# Patient Record
Sex: Male | Born: 1940 | Race: White | Hispanic: No | Marital: Married | Smoking: Never smoker
Health system: Southern US, Community
[De-identification: ages and names within clinical notes are randomized; demographics above are authoritative.]

## PROBLEM LIST (undated history)

## (undated) DIAGNOSIS — I10 Essential (primary) hypertension: Secondary | ICD-10-CM

## (undated) DIAGNOSIS — G4730 Sleep apnea, unspecified: Secondary | ICD-10-CM

## (undated) DIAGNOSIS — M109 Gout, unspecified: Secondary | ICD-10-CM

## (undated) DIAGNOSIS — Z87442 Personal history of urinary calculi: Secondary | ICD-10-CM

## (undated) DIAGNOSIS — M1990 Unspecified osteoarthritis, unspecified site: Secondary | ICD-10-CM

## (undated) DIAGNOSIS — C801 Malignant (primary) neoplasm, unspecified: Secondary | ICD-10-CM

## (undated) HISTORY — PX: TONSILLECTOMY: SUR1361

## (undated) HISTORY — PX: APPENDECTOMY: SHX54

---

## 1999-09-22 DIAGNOSIS — C801 Malignant (primary) neoplasm, unspecified: Secondary | ICD-10-CM

## 1999-09-22 HISTORY — DX: Malignant (primary) neoplasm, unspecified: C80.1

## 1999-09-22 HISTORY — PX: COLON SURGERY: SHX602

## 2013-11-22 ENCOUNTER — Other Ambulatory Visit: Payer: Self-pay | Admitting: Neurosurgery

## 2013-11-22 DIAGNOSIS — M5136 Other intervertebral disc degeneration, lumbar region: Secondary | ICD-10-CM

## 2013-11-27 ENCOUNTER — Ambulatory Visit
Admission: RE | Admit: 2013-11-27 | Discharge: 2013-11-27 | Disposition: A | Discharge status: Home / Self Care | Payer: Medicare Other | Source: Ambulatory Visit | Attending: Neurosurgery | Admitting: Neurosurgery

## 2013-11-27 VITALS — BP 107/75 | HR 73 | Ht 70.0 in | Wt 197.0 lb

## 2013-11-27 DIAGNOSIS — M5136 Other intervertebral disc degeneration, lumbar region: Secondary | ICD-10-CM

## 2013-11-27 DIAGNOSIS — M51369 Other intervertebral disc degeneration, lumbar region without mention of lumbar back pain or lower extremity pain: Secondary | ICD-10-CM

## 2013-11-27 MED ORDER — IOHEXOL 180 MG/ML  SOLN
15.0000 mL | Freq: Once | INTRAMUSCULAR | Status: AC | PRN
  Administered 2013-11-27: 15 mL via INTRATHECAL

## 2013-11-27 MED ORDER — MEPERIDINE HCL 100 MG/ML IJ SOLN
75.0000 mg | Freq: Once | INTRAMUSCULAR | Status: AC
  Administered 2013-11-27: 75 mg via INTRAMUSCULAR

## 2013-11-27 MED ORDER — DIAZEPAM 5 MG PO TABS
5.0000 mg | ORAL_TABLET | Freq: Once | ORAL | Status: AC
  Administered 2013-11-27: 5 mg via ORAL

## 2013-11-27 MED ORDER — ONDANSETRON HCL 4 MG/2ML IJ SOLN
4.0000 mg | Freq: Once | INTRAMUSCULAR | Status: AC
  Administered 2013-11-27: 4 mg via INTRAMUSCULAR

## 2013-11-27 NOTE — Discharge Instructions (Signed)

## 2013-12-18 ENCOUNTER — Other Ambulatory Visit: Payer: Self-pay | Admitting: Neurosurgery

## 2013-12-27 ENCOUNTER — Encounter (HOSPITAL_COMMUNITY): Payer: Self-pay | Admitting: Pharmacy Technician

## 2013-12-28 NOTE — Pre-Procedure Instructions (Addendum)
[name]  12/28/2013   Your procedure is scheduled on:  Friday, April 17.  Report to [facility], Main Entrance [facility]" at  6:55 AM.  Call this number if you have problems the morning of surgery: [phone]   Remember:   Do not eat food or drink liquids after midnight Thursday.   Take these medicines the morning of surgery with A SIP OF WATER: amLODipine (NORVASC).                Take if needed: oxyCODONE (ROXICODONE), colchicine              STOP all herbel meds, nsaids (aleve,naproxen,advil,ibuprofen) 5 days prior to surgery including aspirin, vitamins   Do not wear jewelry, make-up or nail polish.  Do not wear lotions, powders, or perfumes.  Men may shave face and neck.  Do not bring valuables to the hospital.  [facility] is not responsible for any belongings or valuables.               Contacts, dentures or bridgework may not be worn into surgery.  Leave suitcase in the car. After surgery it may be brought to your room.  For patients admitted to the hospital, discharge time is determined by your treatment team.                 Special Instructions: - Special Instructions: [facility] - Preparing for Surgery  Before surgery, you can play an important role.  Because skin is not sterile, your skin needs to be as free of germs as possible.  You can reduce the number of germs on you skin by washing with CHG (chlorahexidine gluconate) soap before surgery.  CHG is an antiseptic cleaner which kills germs and bonds with the skin to continue killing germs even after washing.  Please DO NOT use if you have an allergy to CHG or antibacterial soaps.  If your skin becomes reddened/irritated stop using the CHG and inform your nurse when you arrive at Short Stay.  Do not shave (including legs and underarms) for at least 48 hours prior to the first CHG shower.  You may shave your face.  Please follow these instructions carefully:   1.  Shower with CHG Soap the night  before surgery and the morning of Surgery.  2.  If you choose to wash your hair, wash your hair first as usual with your normal shampoo.  3.  After you shampoo, rinse your hair and body thoroughly to remove the Shampoo.  4.  Use CHG as you would any other liquid soap.  You can apply chg directly  to the skin and wash gently with scrungie or a clean washcloth.  5.  Apply the CHG Soap to your body ONLY FROM THE NECK DOWN.  Do not use on open wounds or open sores.  Avoid contact with your eyes ears, mouth and genitals (private parts).  Wash genitals (private parts)       with your normal soap.  6.  Wash thoroughly, paying special attention to the area where your surgery will be performed.  7.  Thoroughly rinse your body with warm water from the neck down.  8.  DO NOT shower/wash with your normal soap after using and rinsing off the CHG Soap.  9.  Pat yourself dry with a clean towel.            10.  Wear clean pajamas.  11.  Place clean sheets on your bed the night of your first shower and do not sleep with pets.  Day of Surgery  Do not apply any lotions/deodorants the morning of surgery.  Please wear clean clothes to the hospital/surgery center.   Please read over the following fact sheets that you were given: Pain Booklet, Coughing and Deep Breathing, Blood Transfusion Information and Surgical Site Infection Prevention

## 2013-12-29 ENCOUNTER — Encounter (HOSPITAL_COMMUNITY)
Admission: RE | Admit: 2013-12-29 | Discharge: 2013-12-29 | Disposition: A | Discharge status: Home / Self Care | Payer: Medicare Other | Source: Ambulatory Visit | Attending: Anesthesiology | Admitting: Anesthesiology

## 2013-12-29 ENCOUNTER — Encounter (HOSPITAL_COMMUNITY)
Admission: RE | Admit: 2013-12-29 | Discharge: 2013-12-29 | Disposition: A | Discharge status: Home / Self Care | Payer: Medicare Other | Source: Ambulatory Visit | Attending: Neurosurgery | Admitting: Neurosurgery

## 2013-12-29 ENCOUNTER — Encounter (HOSPITAL_COMMUNITY): Payer: Self-pay

## 2013-12-29 DIAGNOSIS — Z01818 Encounter for other preprocedural examination: Secondary | ICD-10-CM | POA: Insufficient documentation

## 2013-12-29 DIAGNOSIS — Z01810 Encounter for preprocedural cardiovascular examination: Secondary | ICD-10-CM | POA: Insufficient documentation

## 2013-12-29 DIAGNOSIS — Z01812 Encounter for preprocedural laboratory examination: Secondary | ICD-10-CM | POA: Insufficient documentation

## 2013-12-29 HISTORY — DX: Sleep apnea, unspecified: G47.30

## 2013-12-29 HISTORY — DX: Malignant (primary) neoplasm, unspecified: C80.1

## 2013-12-29 HISTORY — DX: Essential (primary) hypertension: I10

## 2013-12-29 HISTORY — DX: Personal history of urinary calculi: Z87.442

## 2013-12-29 HISTORY — DX: Unspecified osteoarthritis, unspecified site: M19.90

## 2013-12-29 HISTORY — DX: Gout, unspecified: M10.9

## 2013-12-29 LAB — CBC
HCT: 37.2 % — ABNORMAL LOW (ref 39.0–52.0)
HEMOGLOBIN: 12.6 g/dL — AB (ref 13.0–17.0)
MCH: 31.2 pg (ref 26.0–34.0)
MCHC: 33.9 g/dL (ref 30.0–36.0)
MCV: 92.1 fL (ref 78.0–100.0)
Platelets: 399 10*3/uL (ref 150–400)
RBC: 4.04 MIL/uL — ABNORMAL LOW (ref 4.22–5.81)
RDW: 13.2 % (ref 11.5–15.5)
WBC: 7.6 10*3/uL (ref 4.0–10.5)

## 2013-12-29 LAB — BASIC METABOLIC PANEL
BUN: 26 mg/dL — ABNORMAL HIGH (ref 6–23)
CO2: 22 meq/L (ref 19–32)
Calcium: 10.4 mg/dL (ref 8.4–10.5)
Chloride: 105 mEq/L (ref 96–112)
Creatinine, Ser: 1.31 mg/dL (ref 0.50–1.35)
GFR calc Af Amer: 61 mL/min — ABNORMAL LOW (ref >90)
GFR calc non Af Amer: 52 mL/min — ABNORMAL LOW (ref >90)
GLUCOSE: 82 mg/dL (ref 70–99)
Potassium: 4.8 mEq/L (ref 3.7–5.3)
SODIUM: 141 meq/L (ref 137–147)

## 2013-12-29 LAB — ABO/RH: ABO/RH(D): O POS

## 2013-12-29 LAB — SURGICAL PCR SCREEN
MRSA, PCR: NEGATIVE
STAPHYLOCOCCUS AUREUS: NEGATIVE

## 2013-12-29 LAB — TYPE AND SCREEN
ABO/RH(D): O POS
Antibody Screen: NEGATIVE

## 2014-01-04 MED ORDER — CEFAZOLIN SODIUM-DEXTROSE 2-3 GM-% IV SOLR
2.0000 g | INTRAVENOUS | Status: AC
  Administered 2014-01-05: 2 g via INTRAVENOUS
  Filled 2014-01-04: qty 50

## 2014-01-04 NOTE — H&P (Signed)
[name] is an 73 y.o. male.   Chief Complaint: right leg pain HPI: patient seen because of lumbar pain with radiation to the right leg all the way to the kne but not below. This pain has been going on for at least 2 years. Had 6 epidural with not improvement. No pain in the left leg.   Past Medical History  Diagnosis Date  . Hypertension   . Sleep apnea     cpap 7 yrs  . History of kidney stones   . Arthritis   . Gout   . Cancer 01    colon    Past Surgical History  Procedure Laterality Date  . Colon surgery  01    ca  . Tonsillectomy    . Appendectomy      No family history on file. Social History:  reports that he has never smoked. He has never used smokeless tobacco. He reports that he does not drink alcohol or use illicit drugs.  Allergies: No Known Allergies  No prescriptions prior to admission    No results found for this or any previous visit (from the past 48 hour(s)). No results found.  Review of Systems  Constitutional: Negative.   HENT: Negative.   Eyes: Negative.   Respiratory: Negative.   Cardiovascular: Negative.   Gastrointestinal:       Scar for colon surgery  Genitourinary: Negative.   Musculoskeletal: Positive for back pain.  Skin: Negative.   Neurological: Positive for focal weakness.  Psychiatric/Behavioral: Negative.   positive for lumbar and right leg pain.   There were no vitals taken for this visit. Physical Exam hent, nl. Neck, nl. Cv, nl. Lugs, clear. Abdomen scar from surgery. Extremities there is a difference of 3 to 4 inches between the legs. Neuro,  Dtr, nl.   Assessment/Plan Patient to go ahead with decompression at l3-4 and 4-5 with fusion at the l4-5 level with cages and screws. Patient and wife are aware of risks and benefits  Floyce Stakes 01/04/2014, 6:08 PM

## 2014-01-05 ENCOUNTER — Inpatient Hospital Stay (HOSPITAL_COMMUNITY): Payer: Medicare Other

## 2014-01-05 ENCOUNTER — Encounter (HOSPITAL_COMMUNITY): Payer: Medicare Other | Admitting: Anesthesiology

## 2014-01-05 ENCOUNTER — Inpatient Hospital Stay (HOSPITAL_COMMUNITY)
Admission: RE | Admit: 2014-01-05 | Discharge: 2014-01-11 | DRG: 460 | Disposition: A | Discharge status: Rehabilitation Facility | Payer: Medicare Other | Source: Ambulatory Visit | Attending: Neurosurgery | Admitting: Neurosurgery

## 2014-01-05 ENCOUNTER — Inpatient Hospital Stay (HOSPITAL_COMMUNITY): Payer: Medicare Other | Admitting: Anesthesiology

## 2014-01-05 ENCOUNTER — Encounter (HOSPITAL_COMMUNITY): Payer: Self-pay | Admitting: *Deleted

## 2014-01-05 ENCOUNTER — Encounter (HOSPITAL_COMMUNITY)
Admission: RE | Disposition: A | Discharge status: Rehabilitation Facility | Payer: Self-pay | Source: Ambulatory Visit | Attending: Neurosurgery

## 2014-01-05 DIAGNOSIS — Q762 Congenital spondylolisthesis: Secondary | ICD-10-CM

## 2014-01-05 DIAGNOSIS — G4730 Sleep apnea, unspecified: Secondary | ICD-10-CM | POA: Diagnosis present

## 2014-01-05 DIAGNOSIS — M4316 Spondylolisthesis, lumbar region: Secondary | ICD-10-CM | POA: Diagnosis present

## 2014-01-05 DIAGNOSIS — M48061 Spinal stenosis, lumbar region without neurogenic claudication: Principal | ICD-10-CM | POA: Diagnosis present

## 2014-01-05 DIAGNOSIS — I10 Essential (primary) hypertension: Secondary | ICD-10-CM | POA: Diagnosis present

## 2014-01-05 DIAGNOSIS — M109 Gout, unspecified: Secondary | ICD-10-CM | POA: Diagnosis present

## 2014-01-05 DIAGNOSIS — K5900 Constipation, unspecified: Secondary | ICD-10-CM | POA: Diagnosis present

## 2014-01-05 LAB — CREATININE, SERUM
CREATININE: 1.36 mg/dL — AB (ref 0.50–1.35)
GFR calc Af Amer: 58 mL/min — ABNORMAL LOW (ref >90)
GFR calc non Af Amer: 50 mL/min — ABNORMAL LOW (ref >90)

## 2014-01-05 LAB — CBC
HCT: 38.1 % — ABNORMAL LOW (ref 39.0–52.0)
HEMOGLOBIN: 12.7 g/dL — AB (ref 13.0–17.0)
MCH: 30.8 pg (ref 26.0–34.0)
MCHC: 33.3 g/dL (ref 30.0–36.0)
MCV: 92.3 fL (ref 78.0–100.0)
Platelets: 388 10*3/uL (ref 150–400)
RBC: 4.13 MIL/uL — ABNORMAL LOW (ref 4.22–5.81)
RDW: 13.6 % (ref 11.5–15.5)
WBC: 17.8 10*3/uL — ABNORMAL HIGH (ref 4.0–10.5)

## 2014-01-05 SURGERY — POSTERIOR LUMBAR FUSION 1 LEVEL
Anesthesia: General | Site: Back

## 2014-01-05 MED ORDER — OXYCODONE-ACETAMINOPHEN 5-325 MG PO TABS
1.0000 | ORAL_TABLET | ORAL | Status: DC | PRN
  Administered 2014-01-05 – 2014-01-06 (×3): 2 via ORAL
  Filled 2014-01-05 (×3): qty 2

## 2014-01-05 MED ORDER — ONDANSETRON HCL 4 MG/2ML IJ SOLN: 4.0000 mg | Freq: Four times a day (QID) | INTRAMUSCULAR | Status: DC | PRN

## 2014-01-05 MED ORDER — EPHEDRINE SULFATE 50 MG/ML IJ SOLN
INTRAMUSCULAR | Status: AC
  Filled 2014-01-05: qty 1

## 2014-01-05 MED ORDER — TAMSULOSIN HCL 0.4 MG PO CAPS
0.4000 mg | ORAL_CAPSULE | Freq: Every day | ORAL | Status: DC
  Administered 2014-01-06 – 2014-01-11 (×6): 0.4 mg via ORAL
  Filled 2014-01-05 (×6): qty 1

## 2014-01-05 MED ORDER — OXYCODONE HCL 5 MG/5ML PO SOLN: 5.0000 mg | Freq: Once | ORAL | Status: AC | PRN

## 2014-01-05 MED ORDER — DIAZEPAM 5 MG PO TABS
ORAL_TABLET | ORAL | Status: AC
Start: 2014-01-05 — End: 2014-01-06
  Filled 2014-01-05: qty 1

## 2014-01-05 MED ORDER — LIDOCAINE HCL (CARDIAC) 20 MG/ML IV SOLN
INTRAVENOUS | Status: AC
  Filled 2014-01-05: qty 5

## 2014-01-05 MED ORDER — PHENOL 1.4 % MT LIQD: 1.0000 | OROMUCOSAL | Status: DC | PRN

## 2014-01-05 MED ORDER — COLCHICINE 0.6 MG PO TABS
0.6000 mg | ORAL_TABLET | Freq: Three times a day (TID) | ORAL | Status: DC
  Administered 2014-01-05 – 2014-01-11 (×18): 0.6 mg via ORAL
  Filled 2014-01-05 (×19): qty 1

## 2014-01-05 MED ORDER — NALOXONE HCL 0.4 MG/ML IJ SOLN: 0.4000 mg | INTRAMUSCULAR | Status: DC | PRN

## 2014-01-05 MED ORDER — THROMBIN 20000 UNITS EX SOLR
CUTANEOUS | Status: DC | PRN
  Administered 2014-01-05: 11:00:00 via TOPICAL

## 2014-01-05 MED ORDER — HYDROMORPHONE HCL PF 1 MG/ML IJ SOLN
INTRAMUSCULAR | Status: AC
  Filled 2014-01-05: qty 1

## 2014-01-05 MED ORDER — LACTATED RINGERS IV SOLN
INTRAVENOUS | Status: DC
  Administered 2014-01-05: 07:00:00 via INTRAVENOUS

## 2014-01-05 MED ORDER — AMLODIPINE BESYLATE 5 MG PO TABS
5.0000 mg | ORAL_TABLET | Freq: Every day | ORAL | Status: DC
  Administered 2014-01-06 – 2014-01-11 (×6): 5 mg via ORAL
  Filled 2014-01-05 (×7): qty 1

## 2014-01-05 MED ORDER — PROPOFOL 10 MG/ML IV BOLUS
INTRAVENOUS | Status: AC
  Filled 2014-01-05: qty 20

## 2014-01-05 MED ORDER — SODIUM CHLORIDE 0.9 % IJ SOLN: 9.0000 mL | INTRAMUSCULAR | Status: DC | PRN

## 2014-01-05 MED ORDER — ACETAMINOPHEN 650 MG RE SUPP: 650.0000 mg | RECTAL | Status: DC | PRN

## 2014-01-05 MED ORDER — BACITRACIN ZINC 500 UNIT/GM EX OINT
TOPICAL_OINTMENT | CUTANEOUS | Status: DC | PRN
  Administered 2014-01-05: 1 via TOPICAL

## 2014-01-05 MED ORDER — ONDANSETRON HCL 4 MG/2ML IJ SOLN
INTRAMUSCULAR | Status: AC
  Filled 2014-01-05: qty 2

## 2014-01-05 MED ORDER — MIDAZOLAM HCL 2 MG/2ML IJ SOLN
INTRAMUSCULAR | Status: AC
  Filled 2014-01-05: qty 2

## 2014-01-05 MED ORDER — SODIUM CHLORIDE 0.9 % IV SOLN: 250.0000 mL | INTRAVENOUS | Status: DC

## 2014-01-05 MED ORDER — SODIUM CHLORIDE 0.9 % IJ SOLN
INTRAMUSCULAR | Status: AC
  Filled 2014-01-05: qty 10

## 2014-01-05 MED ORDER — 0.9 % SODIUM CHLORIDE (POUR BTL) OPTIME
TOPICAL | Status: DC | PRN
  Administered 2014-01-05: 1000 mL

## 2014-01-05 MED ORDER — MORPHINE SULFATE (PF) 1 MG/ML IV SOLN
INTRAVENOUS | Status: DC
Start: 2014-01-05 — End: 2014-01-05

## 2014-01-05 MED ORDER — ACETAMINOPHEN 325 MG PO TABS
650.0000 mg | ORAL_TABLET | ORAL | Status: DC | PRN
  Administered 2014-01-08 – 2014-01-11 (×5): 650 mg via ORAL
  Filled 2014-01-05 (×6): qty 2

## 2014-01-05 MED ORDER — FENTANYL CITRATE 0.05 MG/ML IJ SOLN
INTRAMUSCULAR | Status: DC | PRN
  Administered 2014-01-05 (×4): 50 ug via INTRAVENOUS
  Administered 2014-01-05: 100 ug via INTRAVENOUS
  Administered 2014-01-05 (×4): 50 ug via INTRAVENOUS

## 2014-01-05 MED ORDER — DEXAMETHASONE SODIUM PHOSPHATE 4 MG/ML IJ SOLN
INTRAMUSCULAR | Status: DC | PRN
  Administered 2014-01-05: 8 mg via INTRAVENOUS

## 2014-01-05 MED ORDER — MORPHINE SULFATE (PF) 1 MG/ML IV SOLN
INTRAVENOUS | Status: DC
  Filled 2014-01-05: qty 25

## 2014-01-05 MED ORDER — SODIUM CHLORIDE 0.9 % IJ SOLN
9.0000 mL | INTRAMUSCULAR | Status: DC | PRN
Start: 2014-01-05 — End: 2014-01-05

## 2014-01-05 MED ORDER — ONDANSETRON HCL 4 MG/2ML IJ SOLN
INTRAMUSCULAR | Status: DC | PRN
  Administered 2014-01-05: 4 mg via INTRAVENOUS

## 2014-01-05 MED ORDER — HEMOSTATIC AGENTS (NO CHARGE) OPTIME
TOPICAL | Status: DC | PRN
  Administered 2014-01-05: 1 via TOPICAL

## 2014-01-05 MED ORDER — FENTANYL CITRATE 0.05 MG/ML IJ SOLN
INTRAMUSCULAR | Status: AC
  Filled 2014-01-05: qty 5

## 2014-01-05 MED ORDER — GLYCOPYRROLATE 0.2 MG/ML IJ SOLN
INTRAMUSCULAR | Status: AC
  Filled 2014-01-05: qty 2

## 2014-01-05 MED ORDER — OXYCODONE HCL 5 MG PO TABS
5.0000 mg | ORAL_TABLET | Freq: Once | ORAL | Status: AC | PRN
  Administered 2014-01-05: 5 mg via ORAL

## 2014-01-05 MED ORDER — MIDAZOLAM HCL 5 MG/5ML IJ SOLN
INTRAMUSCULAR | Status: DC | PRN
  Administered 2014-01-05: 2 mg via INTRAVENOUS

## 2014-01-05 MED ORDER — HEPARIN SODIUM (PORCINE) 5000 UNIT/ML IJ SOLN
5000.0000 [IU] | Freq: Three times a day (TID) | INTRAMUSCULAR | Status: DC
  Administered 2014-01-05 – 2014-01-11 (×18): 5000 [IU] via SUBCUTANEOUS
  Filled 2014-01-05 (×20): qty 1

## 2014-01-05 MED ORDER — ZOLPIDEM TARTRATE 5 MG PO TABS: 5.0000 mg | ORAL_TABLET | Freq: Every evening | ORAL | Status: DC | PRN

## 2014-01-05 MED ORDER — CEFAZOLIN SODIUM 1-5 GM-% IV SOLN
1.0000 g | Freq: Three times a day (TID) | INTRAVENOUS | Status: AC
  Administered 2014-01-05 – 2014-01-06 (×2): 1 g via INTRAVENOUS
  Filled 2014-01-05 (×2): qty 50

## 2014-01-05 MED ORDER — VECURONIUM BROMIDE 10 MG IV SOLR
INTRAVENOUS | Status: AC
  Filled 2014-01-05: qty 10

## 2014-01-05 MED ORDER — DIAZEPAM 5 MG PO TABS
5.0000 mg | ORAL_TABLET | Freq: Four times a day (QID) | ORAL | Status: DC | PRN
Start: 2014-01-05 — End: 2014-01-08
  Administered 2014-01-05 – 2014-01-08 (×9): 5 mg via ORAL
  Filled 2014-01-05 (×9): qty 1

## 2014-01-05 MED ORDER — HYDROMORPHONE HCL PF 1 MG/ML IJ SOLN
0.2500 mg | INTRAMUSCULAR | Status: DC | PRN
  Administered 2014-01-05 (×6): 0.5 mg via INTRAVENOUS

## 2014-01-05 MED ORDER — FEBUXOSTAT 40 MG PO TABS
40.0000 mg | ORAL_TABLET | Freq: Every day | ORAL | Status: DC | PRN
  Filled 2014-01-05: qty 1

## 2014-01-05 MED ORDER — DIPHENHYDRAMINE HCL 12.5 MG/5ML PO ELIX: 12.5000 mg | ORAL_SOLUTION | Freq: Four times a day (QID) | ORAL | Status: DC | PRN

## 2014-01-05 MED ORDER — DIPHENHYDRAMINE HCL 50 MG/ML IJ SOLN: 12.5000 mg | Freq: Four times a day (QID) | INTRAMUSCULAR | Status: DC | PRN

## 2014-01-05 MED ORDER — SODIUM CHLORIDE 0.9 % IJ SOLN
3.0000 mL | Freq: Two times a day (BID) | INTRAMUSCULAR | Status: DC
  Administered 2014-01-06 – 2014-01-11 (×7): 3 mL via INTRAVENOUS

## 2014-01-05 MED ORDER — LACTATED RINGERS IV SOLN
INTRAVENOUS | Status: DC | PRN
  Administered 2014-01-05 (×3): via INTRAVENOUS

## 2014-01-05 MED ORDER — SODIUM CHLORIDE 0.9 % IJ SOLN: 3.0000 mL | INTRAMUSCULAR | Status: DC | PRN

## 2014-01-05 MED ORDER — PROPOFOL 10 MG/ML IV BOLUS
INTRAVENOUS | Status: DC | PRN
  Administered 2014-01-05: 130 mg via INTRAVENOUS

## 2014-01-05 MED ORDER — SODIUM CHLORIDE 0.9 % IV SOLN
INTRAVENOUS | Status: DC
  Administered 2014-01-05: 17:00:00 via INTRAVENOUS
  Administered 2014-01-06: 75 mL/h via INTRAVENOUS
  Administered 2014-01-06: 1000 mL via INTRAVENOUS

## 2014-01-05 MED ORDER — ONDANSETRON HCL 4 MG/2ML IJ SOLN: 4.0000 mg | INTRAMUSCULAR | Status: DC | PRN

## 2014-01-05 MED ORDER — THROMBIN 5000 UNITS EX SOLR
CUTANEOUS | Status: DC | PRN
  Administered 2014-01-05: 12:00:00 via TOPICAL

## 2014-01-05 MED ORDER — ROCURONIUM BROMIDE 100 MG/10ML IV SOLN
INTRAVENOUS | Status: DC | PRN
  Administered 2014-01-05: 50 mg via INTRAVENOUS
  Administered 2014-01-05: 20 mg via INTRAVENOUS

## 2014-01-05 MED ORDER — SODIUM CHLORIDE 0.9 % IV SOLN
INTRAVENOUS | Status: DC | PRN
  Administered 2014-01-05: 15:00:00 via INTRAVENOUS

## 2014-01-05 MED ORDER — LIDOCAINE HCL (CARDIAC) 20 MG/ML IV SOLN
INTRAVENOUS | Status: DC | PRN
  Administered 2014-01-05: 80 mg via INTRAVENOUS

## 2014-01-05 MED ORDER — MORPHINE SULFATE (PF) 1 MG/ML IV SOLN
INTRAVENOUS | Status: DC
  Administered 2014-01-05: 18:00:00 via INTRAVENOUS
  Administered 2014-01-05: 17 mg via INTRAVENOUS
  Administered 2014-01-06: 6 mg via INTRAVENOUS
  Administered 2014-01-06: 7.5 mg via INTRAVENOUS
  Filled 2014-01-05: qty 25

## 2014-01-05 MED ORDER — OXYCODONE HCL 5 MG PO TABS
ORAL_TABLET | ORAL | Status: AC
  Filled 2014-01-05: qty 1

## 2014-01-05 MED ORDER — ARTIFICIAL TEARS OP OINT
TOPICAL_OINTMENT | OPHTHALMIC | Status: DC | PRN
  Administered 2014-01-05: 1 via OPHTHALMIC

## 2014-01-05 MED ORDER — MENTHOL 3 MG MT LOZG: 1.0000 | LOZENGE | OROMUCOSAL | Status: DC | PRN

## 2014-01-05 MED ORDER — ROCURONIUM BROMIDE 50 MG/5ML IV SOLN
INTRAVENOUS | Status: AC
  Filled 2014-01-05: qty 1

## 2014-01-05 MED ORDER — HYDROMORPHONE HCL PF 1 MG/ML IJ SOLN: 0.2500 mg | INTRAMUSCULAR | Status: DC | PRN

## 2014-01-05 SURGICAL SUPPLY — 89 items
BENZOIN TINCTURE PRP APPL 2/3 (GAUZE/BANDAGES/DRESSINGS) ×3 IMPLANT
BLADE SURG ROTATE 9660 (MISCELLANEOUS) IMPLANT
BUR ACORN 6.0 (BURR) ×4 IMPLANT
BUR ACORN 6.0MM (BURR) ×2
BUR MATCHSTICK NEURO 3.0 LAGG (BURR) ×3 IMPLANT
CANISTER SUCT 3000ML (MISCELLANEOUS) ×3 IMPLANT
CAP REVERE LOCKING (Cap) ×12 IMPLANT
CLOSURE WOUND 1/2 X4 (GAUZE/BANDAGES/DRESSINGS) ×1
CONN CROSSLINK REV 6.35 48-60 (Connector) ×3 IMPLANT
CONNECTOR CRSLNK REV6.35 48-60 (Connector) ×1 IMPLANT
CONT SPEC 4OZ CLIKSEAL STRL BL (MISCELLANEOUS) ×3 IMPLANT
COVER BACK TABLE 24X17X13 BIG (DRAPES) IMPLANT
COVER TABLE BACK 60X90 (DRAPES) ×3 IMPLANT
DRAPE C-ARM 42X72 X-RAY (DRAPES) ×6 IMPLANT
DRAPE LAPAROTOMY 100X72X124 (DRAPES) ×3 IMPLANT
DRAPE MICROSCOPE LEICA (MISCELLANEOUS) ×3 IMPLANT
DRAPE POUCH INSTRU U-SHP 10X18 (DRAPES) ×3 IMPLANT
DRAPE PROXIMA HALF (DRAPES) ×6 IMPLANT
DRSG OPSITE POSTOP 4X8 (GAUZE/BANDAGES/DRESSINGS) ×3 IMPLANT
DURAPREP 26ML APPLICATOR (WOUND CARE) ×3 IMPLANT
DURASEAL APPLICATOR TIP (TIP) ×3 IMPLANT
DURASEAL SPINE SEALANT 3ML (MISCELLANEOUS) ×3 IMPLANT
ELECT BLADE 4.0 EZ CLEAN MEGAD (MISCELLANEOUS) ×3
ELECT REM PT RETURN 9FT ADLT (ELECTROSURGICAL) ×3
ELECTRODE BLDE 4.0 EZ CLN MEGD (MISCELLANEOUS) ×1 IMPLANT
ELECTRODE REM PT RTRN 9FT ADLT (ELECTROSURGICAL) ×1 IMPLANT
EVACUATOR 1/8 PVC DRAIN (DRAIN) IMPLANT
GAUZE SPONGE 4X4 16PLY XRAY LF (GAUZE/BANDAGES/DRESSINGS) ×3 IMPLANT
GLOVE BIO SURGEON STRL SZ8 (GLOVE) ×3 IMPLANT
GLOVE BIOGEL M 8.0 STRL (GLOVE) ×9 IMPLANT
GLOVE BIOGEL PI IND STRL 7.5 (GLOVE) ×1 IMPLANT
GLOVE BIOGEL PI IND STRL 8 (GLOVE) ×2 IMPLANT
GLOVE BIOGEL PI IND STRL 8.5 (GLOVE) ×1 IMPLANT
GLOVE BIOGEL PI INDICATOR 7.5 (GLOVE) ×2
GLOVE BIOGEL PI INDICATOR 8 (GLOVE) ×4
GLOVE BIOGEL PI INDICATOR 8.5 (GLOVE) ×2
GLOVE ECLIPSE 7.5 STRL STRAW (GLOVE) ×15 IMPLANT
GLOVE EXAM NITRILE LRG STRL (GLOVE) ×6 IMPLANT
GLOVE EXAM NITRILE MD LF STRL (GLOVE) IMPLANT
GLOVE EXAM NITRILE XL STR (GLOVE) IMPLANT
GLOVE EXAM NITRILE XS STR PU (GLOVE) IMPLANT
GLOVE INDICATOR 7.5 STRL GRN (GLOVE) ×3 IMPLANT
GLOVE INDICATOR 8.0 STRL GRN (GLOVE) ×6 IMPLANT
GOWN BRE IMP SLV AUR LG STRL (GOWN DISPOSABLE) IMPLANT
GOWN BRE IMP SLV AUR XL STRL (GOWN DISPOSABLE) IMPLANT
GOWN SPEC L3 XXLG W/TWL (GOWN DISPOSABLE) ×6 IMPLANT
GOWN STRL REIN 2XL LVL4 (GOWN DISPOSABLE) IMPLANT
GOWN STRL REUS W/ TWL LRG LVL3 (GOWN DISPOSABLE) ×1 IMPLANT
GOWN STRL REUS W/ TWL XL LVL3 (GOWN DISPOSABLE) ×3 IMPLANT
GOWN STRL REUS W/TWL 2XL LVL3 (GOWN DISPOSABLE) ×6 IMPLANT
GOWN STRL REUS W/TWL LRG LVL3 (GOWN DISPOSABLE) ×2
GOWN STRL REUS W/TWL XL LVL3 (GOWN DISPOSABLE) ×6
KIT BASIN OR (CUSTOM PROCEDURE TRAY) ×3 IMPLANT
KIT INFUSE MEDIUM (Orthopedic Implant) ×3 IMPLANT
KIT ROOM TURNOVER OR (KITS) ×3 IMPLANT
MILL MEDIUM DISP (BLADE) ×3 IMPLANT
NEEDLE HYPO 18GX1.5 BLUNT FILL (NEEDLE) IMPLANT
NEEDLE HYPO 21X1.5 SAFETY (NEEDLE) IMPLANT
NEEDLE HYPO 25X1 1.5 SAFETY (NEEDLE) ×3 IMPLANT
NS IRRIG 1000ML POUR BTL (IV SOLUTION) ×3 IMPLANT
PACK LAMINECTOMY NEURO (CUSTOM PROCEDURE TRAY) ×3 IMPLANT
PAD ABD 8X10 STRL (GAUZE/BANDAGES/DRESSINGS) IMPLANT
PAD ARMBOARD 7.5X6 YLW CONV (MISCELLANEOUS) ×9 IMPLANT
PATTIES SURGICAL .5 X1 (DISPOSABLE) ×3 IMPLANT
PATTIES SURGICAL .5 X3 (DISPOSABLE) IMPLANT
ROD REVERE 6.35 CURVED 55MM (Rod) ×6 IMPLANT
RUBBERBAND STERILE (MISCELLANEOUS) ×6 IMPLANT
SCREW REVERE 5.5X45 (Screw) ×12 IMPLANT
SPACER SUSTAIN O 10X26 15MM (Spacer) ×6 IMPLANT
SPONGE GAUZE 4X4 12PLY (GAUZE/BANDAGES/DRESSINGS) ×3 IMPLANT
SPONGE LAP 4X18 X RAY DECT (DISPOSABLE) IMPLANT
SPONGE NEURO XRAY DETECT 1X3 (DISPOSABLE) IMPLANT
SPONGE SURGIFOAM ABS GEL 100 (HEMOSTASIS) ×6 IMPLANT
STRIP CLOSURE SKIN 1/2X4 (GAUZE/BANDAGES/DRESSINGS) ×2 IMPLANT
SUT ETHILON 3 0 FSL (SUTURE) ×3 IMPLANT
SUT PROLENE 6 0 BV (SUTURE) ×9 IMPLANT
SUT VIC AB 1 CT1 18XBRD ANBCTR (SUTURE) ×2 IMPLANT
SUT VIC AB 1 CT1 8-18 (SUTURE) ×4
SUT VIC AB 2-0 CP2 18 (SUTURE) ×3 IMPLANT
SUT VIC AB 3-0 SH 8-18 (SUTURE) ×3 IMPLANT
SYR 20CC LL (SYRINGE) IMPLANT
SYR 20ML ECCENTRIC (SYRINGE) ×3 IMPLANT
SYR 5ML LL (SYRINGE) IMPLANT
TAPE CLOTH SURG 4X10 WHT LF (GAUZE/BANDAGES/DRESSINGS) ×3 IMPLANT
TOWEL OR 17X24 6PK STRL BLUE (TOWEL DISPOSABLE) ×3 IMPLANT
TOWEL OR 17X26 10 PK STRL BLUE (TOWEL DISPOSABLE) ×3 IMPLANT
TRAY FOLEY CATH 14FRSI W/METER (CATHETERS) ×3 IMPLANT
TRAY FOLEY CATH 16FRSI W/METER (SET/KITS/TRAYS/PACK) IMPLANT
WATER STERILE IRR 1000ML POUR (IV SOLUTION) ×3 IMPLANT

## 2014-01-05 NOTE — Progress Notes (Signed)
C/o incisional pain. No weakness. Sensory normal

## 2014-01-05 NOTE — Transfer of Care (Signed)
Immediate Anesthesia Transfer of Care Note  Patient: [name]  Procedure(s) Performed: Procedure(s) with comments: POSTERIOR LUMBAR FUSION 1 LEVEL (N/A) - Posterior Lumbar Four-Five Interbody and Fusion with Lumbar Three Laminectomy  Patient Location: PACU  Anesthesia Type:General  Level of Consciousness: awake, alert , oriented and patient cooperative  Airway & Oxygen Therapy: Patient Spontanous Breathing and Patient connected to nasal cannula oxygen  Post-op Assessment: Report given to PACU RN and Post -op Vital signs reviewed and stable  Post vital signs: Reviewed and stable  Complications: No apparent anesthesia complications

## 2014-01-05 NOTE — Progress Notes (Signed)
Patient is on PCA pump with end tidal capnography.  CPAP therapy would interfere with end tidal readings.  CPAP therapy will be started tomorrow night or when PCA is discontinued.  RN aware.

## 2014-01-05 NOTE — Progress Notes (Signed)
Pt snoring at this time, reminding him to deep breath

## 2014-01-05 NOTE — Progress Notes (Signed)
OP NOTE [id number]

## 2014-01-05 NOTE — Progress Notes (Signed)
Pt. Still a 10/10 with pain after 2 of Dilaudid, valium and oxy. Dr Marcie Bal aware and orders noted

## 2014-01-05 NOTE — Anesthesia Preprocedure Evaluation (Addendum)
Anesthesia Evaluation  Patient identified by MRN, date of birth, ID band Patient awake    Reviewed: Allergy & Precautions, H&P , NPO status , Patient's Chart, lab work & pertinent test results  History of Anesthesia Complications Negative for: history of anesthetic complications  Airway Mallampati: II TM Distance: >3 FB Neck ROM: full    Dental  (+) Dental Advisory Given, Teeth Intact   Pulmonary sleep apnea and Continuous Positive Airway Pressure Ventilation ,          Cardiovascular hypertension, Pt. on medications     Neuro/Psych negative neurological ROS  negative psych ROS   GI/Hepatic negative GI ROS, Neg liver ROS,   Endo/Other  negative endocrine ROS  Renal/GU Renal InsufficiencyRenal disease     Musculoskeletal  (+) Arthritis -,   Abdominal   Peds  Hematology negative hematology ROS (+)   Anesthesia Other Findings   Reproductive/Obstetrics                         Anesthesia Physical Anesthesia Plan  ASA: II  Anesthesia Plan: General   Post-op Pain Management:    Induction: Intravenous  Airway Management Planned: Oral ETT  Additional Equipment:   Intra-op Plan:   Post-operative Plan: Extubation in OR  Informed Consent: I have reviewed the patients History and Physical, chart, labs and discussed the procedure including the risks, benefits and alternatives for the proposed anesthesia with the patient or authorized representative who has indicated his/her understanding and acceptance.     Plan Discussed with: CRNA, Anesthesiologist and Surgeon  Anesthesia Plan Comments:         Anesthesia Quick Evaluation

## 2014-01-06 MED ORDER — HYDROMORPHONE HCL 2 MG PO TABS
2.0000 mg | ORAL_TABLET | ORAL | Status: DC | PRN
  Administered 2014-01-06: 2 mg via ORAL
  Administered 2014-01-06 (×2): 4 mg via ORAL
  Administered 2014-01-07: 2 mg via ORAL
  Administered 2014-01-08 (×3): 4 mg via ORAL
  Administered 2014-01-09 (×2): 2 mg via ORAL
  Administered 2014-01-09 (×2): 4 mg via ORAL
  Administered 2014-01-10: 2 mg via ORAL
  Filled 2014-01-06 (×3): qty 2
  Filled 2014-01-06: qty 1
  Filled 2014-01-06 (×4): qty 2
  Filled 2014-01-06 (×2): qty 1
  Filled 2014-01-06: qty 2
  Filled 2014-01-06: qty 1

## 2014-01-06 MED ORDER — DIPHENHYDRAMINE HCL 25 MG PO CAPS
25.0000 mg | ORAL_CAPSULE | Freq: Four times a day (QID) | ORAL | Status: DC | PRN
  Administered 2014-01-06: 25 mg via ORAL
  Filled 2014-01-06: qty 1

## 2014-01-06 MED ORDER — HYDROMORPHONE HCL PF 1 MG/ML IJ SOLN
1.0000 mg | INTRAMUSCULAR | Status: DC | PRN
  Administered 2014-01-06 – 2014-01-10 (×23): 1 mg via INTRAVENOUS
  Filled 2014-01-06 (×24): qty 1

## 2014-01-06 NOTE — Progress Notes (Signed)
Rounding on patient, patient sleeping at this time.

## 2014-01-06 NOTE — Op Note (Signed)
[name], [name]               ACCOUNT NO.:  [id number]  MEDICAL RECORD NO.:  [id number]  LOCATION:  [id number]                        FACILITY:  [facility]  PHYSICIAN:  Leeroy Cha, M.D.   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  L3-L4 stenosis.  Chronic radiculopathy.  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  L3-L4 stenosis.  Chronic radiculopathy.  PROCEDURE:  Bilaterally L3 laminectomy.  Foraminotomy at L3-4.  L4 laminectomy with facetectomy.  Bilaterally L4-L5 diskectomy medially and laterally, BUN normal, to be able to introduce two cages of 15 x 26. Pedicle screws, L4-L5; posterolateral arthrodesis with BMP and autograft.  Durotomy in the midline.  Microscope.  SURGEON:  Leeroy Cha, M.D.  ASSISTANT:  Kary Kos, M.D.  CLINICAL HISTORY:  Mr. Slates is a gentleman who was in my office complaining of severe back pain, radiation to both legs associated with claudication.  X-rays show he has spondylolisthesis at the level of L4- 5.  Myelogram showed that he has a spondylolisthesis at the level of L4- 5 with stenosis and severe stenosis at the level of L3-4.  Surgery was advised.  He and his wife knew the risk with the surgery such as infection, hematoma, no improvement whatsoever, CSF leak, need for further surgery, and infection.  DESCRIPTION OF PROCEDURE:  The patient was taken to the OR, and after intubation, he was positioned in a prone manner.  The back was cleaned with DuraPrep, and we waited 3 minutes before we were able to dissect the area.  Then, midline incision from L3 down to L4-L5 was made.  The patient had quite a bit of fibrosis and at the end, we were able to dissect the muscle all the way laterally to the L3-4 and L4-5 transverse process.  Several x-rays were  taken during the surgery, and at the end, we localized the area of L3-4 and L4-5.  We started with removal of spinous process of L3 and L4 on the lamina bilaterally.  At the level of L3-4, the patient had quite a bit of hypertrophy of the yellow ligament with calcification.  We used the microscope to be able to dissect the ligament away from the dura mater, and at the end, we had plenty of space for the thecal sac as well as the L3 and L4 nerve root after decompression of the foramen.  At the level of L4, the patient had quite a bit of calcification extending into the right L5 nerve root with calcification.  Dissection was carried out, and at the end, we had plenty of space for the L4-L5 thecal sac as well as the L4-L5 nerve root.  There was open in the midline of the dura mater posterolateral to the right, which was closed with running stitch of Prolene 6-0. Valsalva up to 40 was negative.  From then on, we retracted the thecal sac in the right side first and we entered the L4-5 disk space and total gross diskectomy medial and lateral was achieved.  Then, the same procedure was done  in the left side.  Endplates were removed.  This procedure was done more than normal, to be able to introduce two cages, which we did of 15 x 26 with autograft and BMP in the front of the cages.  The rest of the disk space was filled with autograft.  Then, using the C-arm first in AP view and then in lateral view, we were able to make holes at the pedicle of L4 and L5.  Prior to introduce the screws, we filled all the four quadrants just to be sure that we were surrounded by bone.  Then, four screws  5.5 x 45 were inserted and kept in place with rods and Capps.  Then, a cross-link to right was done.  We went laterally and the periosteum of the lateral aspect of the L4-5 and L3-4 were removed and a mix of BMP and autograft was used for arthrodesis.  One more time, we did Valsalva maneuver up to 40  and essentially was negative.  From then on, hemostasis of the area was accomplished.  There was no need to use any drain.  The wound was closed in three different layers of Vicryl and the skin with nylon.  The patient is going to remain flat in bed.  At the end when the patient woke up, was complaining of incisional pain, but able to move both lower extremities.          ______________________________ Leeroy Cha, M.D.     EB/MEDQ  D:  01/05/2014  T:  01/06/2014  Job:  [id number]

## 2014-01-06 NOTE — Progress Notes (Signed)
Patient set up with a hospital provided CPAP machine.  Patient is using his nasal pillows from home.  Auto titration mode (with min 5, max 20cmH20) was used as patient is unsure of home settings.  Room air.  Patient is familiar with equipment and procedure.

## 2014-01-06 NOTE — Progress Notes (Signed)
Patient complaining of itchiness, paged Neurosurgery on call through answering services.

## 2014-01-06 NOTE — Progress Notes (Signed)
OT Cancellation Note  Patient Details Name: [name] MRN: [id number] DOB: [birth year]   Cancelled Treatment:    Reason Eval/Treat Not Completed: Medical issues which prohibited therapy. On bedrest for 48 hrs for dural tear.  01/06/2014 Luther Bradley OTR/L Pager [phone] Office [phone]

## 2014-01-06 NOTE — Progress Notes (Signed)
Notified Saintclair Halsted MD of patient complaining of itchiness. Could not determine source of itchiness since patient has received Dilaudid and also Morphine today.  Per wife patient was given lotion to alleviate itchiness and stated that itchiness occurred after surgery. Orders received. Will continue to monitor.

## 2014-01-06 NOTE — Progress Notes (Signed)
Patient ID: [name], male   DOB: Dec 20, 1940, 73 y.o.   MRN: [id number] Subjective:  The patient is alert. He complains of back pain. He denies headaches. I spoke with his wife.  Objective: Vital signs in last 24 hours: Temp:  [97.3 F (36.3 C)-99.7 F (37.6 C)] 98.8 F (37.1 C) (04/18 0502) Pulse Rate:  [84-96] 84 (04/18 0502) Resp:  [12-26] 16 (04/18 0821) BP: (147-165)/(70-89) 162/73 mmHg (04/18 0502) SpO2:  [95 %-100 %] 100 % (04/18 0821) Weight:  [91.354 kg (201 lb 6.4 oz)] 91.354 kg (201 lb 6.4 oz) (04/17 1658)  Intake/Output from previous day: 04/17 0701 - 04/18 0700 In: 3565 [I.V.:3450; Blood:115] Out: 2200 [Urine:1750; Blood:450] Intake/Output this shift: Total I/O In: 75 [I.V.:75] Out: -   Physical exam the patient is alert and oriented. He is moving his lower extremities well. His dressing is clean and dry.  Lab Results:  Recent Labs  01/05/14 1828  WBC 17.8*  HGB 12.7*  HCT 38.1*  PLT 388   BMET  Recent Labs  01/05/14 1828  CREATININE 1.36*    Studies/Results: Dg Lumbar Spine 2-3 Views  01/05/2014   CLINICAL DATA:  plif 4-5 l3 laminectomy  EXAM: LUMBAR SPINE - 2-3 VIEW  COMPARISON:  None.  FINDINGS: Patient is status post posterior fusion L4-5. Hardware appears intact without loosening or failure. Native osseous structures unremarkable. Intervertebral disc graft markers at L4-5.  IMPRESSION: Patient is status post L4-5 fusion.   Electronically Signed   By: Margaree Mackintosh M.D.   On: 01/05/2014 15:40   Dg Lumbar Spine Complete  01/05/2014   CLINICAL DATA:  L3-4 decompression/L4-5 fusion  EXAM: LUMBAR SPINE - COMPLETE 4+ VIEW  COMPARISON:  None.  FINDINGS: For lateral views of the lumbar spine were obtained intraoperatively. The initial film demonstrates surgical instruments in the posterior soft tissues at the L3 and L4 level. A surgical sponge is also noted.  The second film shows a surgical instrument within the canal just posterior to the superior  endplate of L4. Surgical retractors are noted.  The third film demonstrates a surgical instrument posterior to the inferior endplate of L4.  The fourth film shows multiple instruments spanning the L3-4 and L4-5 interspace.   Electronically Signed   By: Inez Catalina M.D.   On: 01/05/2014 13:39   Dg C-arm 1-60 Min  01/05/2014   CLINICAL DATA:  plif 4-5  EXAM: DG C-ARM 1-60 MIN  : FINDINGS:  Patient is status post posterior fusion L4-5. Hardware intact. Native osseous structures demonstrate no acute abnormalities. Intravertebral disc space marker at L4-5.  IMPRESSION: Patient is status post posterior fusion L4-5.   Electronically Signed   By: Margaree Mackintosh M.D.   On: 01/05/2014 15:42    Assessment/Plan: Postop day 1: The patient will remain flat until least tomorrow. We will continue the PCA pump.  LOS: 1 day     Ophelia Charter 01/06/2014, 8:49 AM

## 2014-01-06 NOTE — Progress Notes (Signed)
PT Cancellation Note  Patient Details Name: Olon Russ MRN: [id number] DOB: 1941/09/10   Cancelled Treatment:    Reason Eval/Treat Not Completed: Medical issues which prohibited therapy.  On bedrest for 48 hours for dural tear. 01/06/2014  [name], PT [phone]  (pager)   [name] Arrington Bencomo 01/06/2014, 11:18 AM

## 2014-01-07 NOTE — Progress Notes (Signed)
Subjective: Patient reports So a lot of abdominal distention pain and back pain without leg pain  Objective: Vital signs in last 24 hours: Temp:  [97.6 F (36.4 C)-98.7 F (37.1 C)] 98.2 F (36.8 C) (04/19 0519) Pulse Rate:  [85-99] 94 (04/19 0519) Resp:  [16-19] 18 (04/19 0519) BP: (117-179)/(54-77) 163/75 mmHg (04/19 0519) SpO2:  [92 %-99 %] 94 % (04/19 0519)  Intake/Output from previous day: 04/18 0701 - 04/19 0700 In: 75 [I.V.:75] Out: 1100 [Urine:1100] Intake/Output this shift:    Abdominal distention no rebound no guarding consistent with ileus neurologically intact incision clean and dry  Lab Results:  Recent Labs  01/05/14 1828  WBC 17.8*  HGB 12.7*  HCT 38.1*  PLT 388   BMET  Recent Labs  01/05/14 1828  CREATININE 1.36*    Studies/Results: Dg Lumbar Spine 2-3 Views  01/05/2014   CLINICAL DATA:  plif 4-5 l3 laminectomy  EXAM: LUMBAR SPINE - 2-3 VIEW  COMPARISON:  None.  FINDINGS: Patient is status post posterior fusion L4-5. Hardware appears intact without loosening or failure. Native osseous structures unremarkable. Intervertebral disc graft markers at L4-5.  IMPRESSION: Patient is status post L4-5 fusion.   Electronically Signed   By: Margaree Mackintosh M.D.   On: 01/05/2014 15:40   Dg Lumbar Spine Complete  01/05/2014   CLINICAL DATA:  L3-4 decompression/L4-5 fusion  EXAM: LUMBAR SPINE - COMPLETE 4+ VIEW  COMPARISON:  None.  FINDINGS: For lateral views of the lumbar spine were obtained intraoperatively. The initial film demonstrates surgical instruments in the posterior soft tissues at the L3 and L4 level. A surgical sponge is also noted.  The second film shows a surgical instrument within the canal just posterior to the superior endplate of L4. Surgical retractors are noted.  The third film demonstrates a surgical instrument posterior to the inferior endplate of L4.  The fourth film shows multiple instruments spanning the L3-4 and L4-5 interspace.    Electronically Signed   By: Inez Catalina M.D.   On: 01/05/2014 13:39   Dg C-arm 1-60 Min  01/05/2014   CLINICAL DATA:  plif 4-5  EXAM: DG C-ARM 1-60 MIN  : FINDINGS:  Patient is status post posterior fusion L4-5. Hardware intact. Native osseous structures demonstrate no acute abnormalities. Intravertebral disc space marker at L4-5.  IMPRESSION: Patient is status post posterior fusion L4-5.   Electronically Signed   By: Margaree Mackintosh M.D.   On: 01/05/2014 15:42    Assessment/Plan: Continued bowel rest continued flat bedrest slow by mouth intake stick with clears until he starts moving gas.  LOS: 2 days     Elaina Hoops 01/07/2014, 8:45 AM

## 2014-01-07 NOTE — Progress Notes (Signed)
PT Cancellation Note  Patient Details Name: [name] MRN: [id number] DOB: 07-29-1941   Cancelled Treatment:    Reason Eval/Treat Not Completed: Medical issues which prohibited therapy.  Continue flat bedrest per neuro progress note this AM.  PT to follow up tomorrow.   Philippa Sicks 01/07/2014, 11:07 AM  Lorrin Goodell, PT  Office # [phone] Pager [phone]

## 2014-01-07 NOTE — Progress Notes (Signed)
Patient hooked up to continuous pulse ox, satting 95% while asleep

## 2014-01-07 NOTE — Progress Notes (Signed)
Ordered a continuous pulse ox for patient since he is getting frequent IV pain medicine

## 2014-01-08 MED ORDER — FLEET ENEMA 7-19 GM/118ML RE ENEM
1.0000 | ENEMA | Freq: Every day | RECTAL | Status: DC | PRN
  Administered 2014-01-09: 1 via RECTAL
  Filled 2014-01-08: qty 1

## 2014-01-08 MED ORDER — DIAZEPAM 5 MG PO TABS
5.0000 mg | ORAL_TABLET | Freq: Three times a day (TID) | ORAL | Status: DC | PRN
  Administered 2014-01-08 – 2014-01-10 (×3): 5 mg via ORAL
  Filled 2014-01-08 (×4): qty 1

## 2014-01-08 MED ORDER — BISACODYL 5 MG PO TBEC
5.0000 mg | DELAYED_RELEASE_TABLET | Freq: Every day | ORAL | Status: DC | PRN
  Administered 2014-01-08: 5 mg via ORAL
  Filled 2014-01-08: qty 1

## 2014-01-08 NOTE — Progress Notes (Signed)
OT Cancellation Note  Patient Details Name: Kenzo Ozment MRN: [id number] DOB: [birth year]   Cancelled Treatment:    Reason Eval/Treat Not Completed: Other (comment);Medical issues which prohibited therapy (pt still on bedrest)  Benito Mccreedy OTR/L [phone]  01/08/2014, 12:18 PM

## 2014-01-08 NOTE — Progress Notes (Signed)
Pt has brought in home CPAP and is now placing self on/off.  RT removed facility cpap from room.

## 2014-01-08 NOTE — Progress Notes (Signed)
UR complete.  Kashish Yglesias RN, MSN 

## 2014-01-08 NOTE — Progress Notes (Signed)
MD called RN ordering for pt to remain in bed flat till he comes in to assess him. Pt remains flat in bed. Spouse remains at bedside and call light within reach. Francis Gaines Toria Monte RN.

## 2014-01-08 NOTE — Evaluation (Signed)
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  s/p bilaterally L3 laminectomy.  Foraminotomy at L3-4.  L4  Clinical Impression  Pt admitted with/for lumbar surgery.  Pt currently limited functionally due to the problems listed. ( See problems list.)   Pt will benefit from PT to maximize function and safety in order to get ready for next venue listed below.     Follow Up Recommendations CIR;Other (comment) (if goes soon, if not HHPT)    Equipment Recommendations  None recommended by PT    Recommendations for Other Services Rehab consult     Precautions / Restrictions Precautions Precautions: Back;Fall Required Braces or Orthoses: Spinal Brace Spinal Brace: Lumbar corset;Applied in sitting position      Mobility  Bed Mobility Overal bed mobility: Needs Assistance;+2 for physical assistance Bed Mobility: Rolling;Sidelying to Sit Rolling: Max assist Sidelying to sit: Mod assist;+2 for physical assistance       General bed mobility comments: cues for prec./technique and truncal assist  Transfers Overall transfer level: Needs assistance Equipment used: Rolling walker (2 wheeled) Transfers: Sit to/from [facility] Sit to Stand: Mod assist;+2 physical assistance Stand pivot transfers: Mod assist;+2 physical assistance       General transfer comment: cues for hand placement and assist for coming forward and lifting.  Ambulation/Gait                Stairs            Wheelchair Mobility    Modified Rankin (Stroke Patients Only)       Balance Overall balance assessment: Needs assistance Sitting-balance support: Feet supported;Bilateral upper extremity supported Sitting balance-Leahy Scale: Poor     Standing balance support: Bilateral upper extremity supported;During  functional activity Standing balance-Leahy Scale: Poor                               Pertinent Vitals/Pain     Home Living Family/patient expects to be discharged to:: Private residence Living Arrangements: Spouse/significant other   Type of Home: House Home Access: Stairs to enter Entrance Stairs-Rails: None Entrance Stairs-Number of Steps: 2 Home Layout: One level Home Equipment: Environmental consultant - 2 wheels;Cane - single point;Bedside commode;Shower seat;Wheelchair - Press photographer      Prior Function Level of Independence: Independent with assistive device(s)               Hand Dominance        Extremity/Trunk Assessment   Upper Extremity Assessment: Generalized weakness           Lower Extremity Assessment: Generalized weakness         Communication   Communication: No difficulties  Cognition Arousal/Alertness: Lethargic Behavior During Therapy: Flat affect Overall Cognitive Status: Within Functional Limits for tasks assessed                      General Comments      Exercises        Assessment/Plan    PT Assessment Patient needs continued PT services  PT Diagnosis Difficulty walking;Generalized weakness   PT Problem List Decreased strength;Decreased activity tolerance;Decreased balance;Decreased mobility;Decreased knowledge of use of DME;Decreased knowledge of precautions;Pain;Impaired sensation  PT Treatment Interventions DME instruction;Gait training;Functional mobility training;Stair training;Therapeutic activities;Patient/family education   PT Goals (Current goals can be  found in the Care Plan section) Acute Rehab PT Goals 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 PT Goal Formulation: With patient Time For Goal Achievement: 01/22/14 Potential to Achieve Goals: Good    Frequency Min 5X/week   Barriers to discharge        Co-evaluation               End of  Session Equipment Utilized During Treatment: Back brace Activity Tolerance: Patient tolerated treatment well;Patient limited by pain;Patient limited by fatigue Patient left: in chair;with call bell/phone within reach;with family/visitor present Nurse Communication: Mobility status         Time: 1450-1544 PT Time Calculation (min): 54 min   Charges:   PT Evaluation $Initial PT Evaluation Tier I: 1 Procedure PT Treatments $Therapeutic Activity: 23-37 mins $Self Care/Home Management: 8-22   PT G CodesTessie Mcdaniel [name] 01/08/2014, 4:54 PM 01/08/2014  [name], [place] [phone]  (pager)

## 2014-01-08 NOTE — Progress Notes (Signed)
Physical Therapy Treatment Patient Details Name: [name] MRN: [id number] DOB: 1940-09-25 Today's Date: 01/08/2014    History of Present Illness Admitted with L34 stenosis and L45 spondylosis and severe stenosis.  s/p bilaterally L3 laminectomy.  Foraminotomy at L3-4.  L4 laminectomy with facetectomy.  Bilaterally L4-L5 diskectomy medially, L45 PLA/5    PT Comments    Pt had difficult time in chair first time up and also had difficultly getting back to bed.  Follow Up Recommendations  CIR;Other (comment)     Equipment Recommendations  None recommended by PT    Recommendations for Other Services Rehab consult     Precautions / Restrictions Precautions Precautions: Back;Fall Required Braces or Orthoses: Spinal Brace Spinal Brace: Lumbar corset;Applied in sitting position    Mobility  Bed Mobility Overal bed mobility: Needs Assistance;+2 for physical assistance Bed Mobility: Rolling;Sit to Sidelying Rolling: Max assist Sidelying to sit: Mod assist;+2 for physical assistance     Sit to sidelying: Max assist;+2 for safety/equipment General bed mobility comments: cues for prec./technique and truncal assist  Transfers Overall transfer level: Needs assistance Equipment used: Rolling walker (2 wheeled);None Transfers: Sit to/from [facility] Sit to Stand: Max assist;+2 physical assistance Stand pivot transfers: Mod assist;+2 physical assistance       General transfer comment: cues for hand placement and assist for coming forward and lifting.  Ambulation/Gait                 Stairs            Wheelchair Mobility    Modified Rankin (Stroke Patients Only)       Balance Overall balance assessment: Needs assistance Sitting-balance support: Feet supported Sitting balance-Leahy Scale: Poor     Standing balance support: Bilateral upper extremity supported;During functional activity Standing balance-Leahy Scale: Poor                      Cognition Arousal/Alertness: Lethargic Behavior During Therapy: Flat affect Overall Cognitive Status: Within Functional Limits for tasks assessed                      Exercises      General Comments        Pertinent Vitals/Pain     Home Living Family/patient expects to be discharged to:: Private residence Living Arrangements: Spouse/significant other   Type of Home: House Home Access: Stairs to enter Entrance Stairs-Rails: None Home Layout: One level Home Equipment: Environmental consultant - 2 wheels;Cane - single point;Bedside commode;Shower seat;Wheelchair - Press photographer      Prior Function Level of Independence: Independent with assistive device(s)          PT Goals (current goals can now be found in the care plan section) Acute Rehab PT Goals 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 PT Goal Formulation: With patient Time For Goal Achievement: 01/22/14 Potential to Achieve Goals: Good Progress towards PT goals: Progressing toward goals    Frequency  Min 5X/week    PT Plan      Co-evaluation             End of Session Equipment Utilized During Treatment: Back brace Activity Tolerance: Patient tolerated treatment well;Patient limited by pain;Patient limited by fatigue Patient left: in chair;with call bell/phone within reach;with family/visitor present     Time: 4401-0272 PT Time Calculation (min): 16 min  Charges:  $Therapeutic Activity: 8-22 mins $Self Care/Home Management: 8-22  G CodesTessie Fass Demitris Pokorny 01/08/2014, 5:02 PM 01/08/2014  [name], [place] [phone]  (pager)

## 2014-01-08 NOTE — Progress Notes (Signed)
Patient ID: [name], male   DOB: Feb 24, 1941, 73 y.o.   MRN: [id number] Saw him about noon today and just now. Had a long talk with his wife and daughter. Aware about the need to stay flat in bed over the weekend. His wound is dry and he has no headache. No weakness and sensation is normal. Was out of bed sitting in   Chair . cpap  At bedside for his sleep apnea. Plan to get rehabilitation to see him, foley out in am. Shower.

## 2014-01-08 NOTE — Progress Notes (Signed)
Patient is not currently wearing CPAP.  Wife states that patient had the mask on but removed it after becoming agitated due to pain.  Patient has access to machine and mask and is familiar with equipment and procedure as he wears at home.

## 2014-01-09 DIAGNOSIS — IMO0002 Reserved for concepts with insufficient information to code with codable children: Secondary | ICD-10-CM

## 2014-01-09 DIAGNOSIS — Q762 Congenital spondylolisthesis: Secondary | ICD-10-CM

## 2014-01-09 MED ORDER — ONDANSETRON HCL 4 MG/2ML IJ SOLN
4.0000 mg | INTRAMUSCULAR | Status: DC | PRN
  Administered 2014-01-09 – 2014-01-10 (×4): 4 mg via INTRAVENOUS
  Filled 2014-01-09 (×3): qty 2

## 2014-01-09 MED ORDER — ONDANSETRON HCL 4 MG/2ML IJ SOLN
4.0000 mg | INTRAMUSCULAR | Status: DC | PRN
  Filled 2014-01-09: qty 2

## 2014-01-09 MED ORDER — ONDANSETRON HCL 4 MG/2ML IJ SOLN: 4.0000 mg | INTRAMUSCULAR | Status: DC | PRN

## 2014-01-09 NOTE — Progress Notes (Signed)
I met with pt and his son at bedside to discuss an inpt rehab admission. Both are in agreement. I will begin [facility] insurance authorization for possible admit tomorrow. [phone]

## 2014-01-09 NOTE — Progress Notes (Signed)
Pt given fleet enema around 0120 and had a large BM, which reduced pt's pain from 10/10 to 3/10.

## 2014-01-09 NOTE — Consult Note (Signed)
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 Joya Salm   HPI: [name] is a 73 y.o. right-handed male with history of hypertension as well as sleep apnea and gout. Patient was independent prior to admission living with his wife. Admitted 01/04/2014 with progressive low back pain that has progressed over the last 2 years. No relief with conservative care including epidural injections. 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 Underwent bilateral L3 laminectomy, foraminotomy bilateral L4-5 discectomy posterior lateral arthrodesis 01/06/2014 per Dr. Joya Salm. Postoperative pain management. Back brace when out of bed applied in sitting position. Subcutaneous heparin for DVT prophylaxis. Physical therapy evaluation completed 01/08/2014 with recommendations for physical medicine rehabilitation consult.   Review of Systems  Eyes: Positive for photophobia.  Gastrointestinal: Positive for constipation.  Musculoskeletal: Positive for back pain, joint pain and myalgias.  All other systems reviewed and are negative.  Past Medical History  Diagnosis Date  . Hypertension   . Sleep apnea     cpap 7 yrs  . History of kidney stones   . Arthritis   . Gout   . Cancer 01    colon   Past Surgical History  Procedure Laterality Date  . Colon surgery  01    ca  . Tonsillectomy    . Appendectomy     History reviewed. No pertinent family history. Social History:  reports that he has never smoked. He has never used smokeless tobacco. He reports that he does not drink alcohol or use illicit drugs. Allergies: No Known Allergies Medications Prior to Admission  Medication Sig Dispense Refill  . amLODipine (NORVASC) 5 MG tablet Take 5 mg by mouth daily.      . Ascorbic Acid (VITAMIN C) 1000 MG tablet Take 1,000 mg by  mouth daily.      . colchicine 0.6 MG tablet Take 0.6 mg by mouth 3 (three) times daily.      . febuxostat (ULORIC) 40 MG tablet Take 40 mg by mouth daily. PRN Gout flares      . Multiple Vitamin (MULTIVITAMIN WITH MINERALS) TABS tablet Take 1 tablet by mouth daily.      Marland Kitchen oxyCODONE (ROXICODONE) 15 MG immediate release tablet Take 15 mg by mouth every 4 (four) hours as needed for pain.        Home: Home Living Family/patient expects to be discharged to:: Private residence Living Arrangements: Spouse/significant other Type of Home: House Home Access: Stairs to enter [employer] of Steps: 2 Entrance Stairs-Rails: None Home Layout: One level Home Equipment: Environmental consultant - 2 wheels;Cane - single point;Bedside commode;Shower seat;Wheelchair - Museum/gallery exhibitions officer History: Prior Function Level of Independence: Independent with assistive device(s) Functional Status:  Mobility: Bed Mobility Overal bed mobility: Needs Assistance;+2 for physical assistance Bed Mobility: Rolling;Sit to Sidelying Rolling: Max assist Sidelying to sit: Mod assist;+2 for physical assistance Sit to sidelying: Max assist;+2 for safety/equipment General bed mobility comments: cues for prec./technique and truncal assist Transfers Overall transfer level: Needs assistance Equipment used: Rolling walker (2 wheeled);None Transfers: Sit to/from [facility] Sit to Stand: Max assist;+2 physical assistance Stand pivot transfers: Mod assist;+2 physical assistance General transfer comment: cues for hand placement and assist for coming forward and lifting.      ADL:    Cognition: Cognition Overall Cognitive Status: Within Functional Limits for tasks assessed Orientation Level: Oriented X4 Cognition Arousal/Alertness: Lethargic  Behavior During Therapy: Flat affect Overall Cognitive Status: Within Functional Limits for tasks assessed  Blood pressure 118/75, pulse 107,  temperature 98.5 F (36.9 C), temperature source Oral, resp. rate 18, height 5\' 9"  (1.753 m), weight 201 lb 6.4 oz (91.354 kg), SpO2 96.00%. Physical Exam  Vitals reviewed. HENT:  Head: Normocephalic.  Eyes: EOM are normal.  Neck: Normal range of motion. Neck supple. No thyromegaly present.  Cardiovascular: Normal rate and regular rhythm.   Respiratory: Effort normal and breath sounds normal. No respiratory distress.  GI: Soft. Bowel sounds are normal. He exhibits no distension.  Neurological:  Patient is a bit lethargic but arousable. He was able to provide his name and age as well as place. He follows simple commands. Needs extra time for orientation. Poor with biographical/historical information. Moves UE's grossly 4/5. LE 1+ HF, 2- KE, ankles 3/5.   Skin:  Back incision is dressed.  Psychiatric:  Flat, fatigued    No results found for this or any previous visit (from the past 24 hour(s)). No results found.  Assessment/Plan: Diagnosis: lumbar spondylolisthesis, severe polyarticular gout 1. Does the need for close, 24 hr/day medical supervision in concert with the patient's rehab needs make it unreasonable for this patient to be served in a less intensive setting? Yes 2. Co-Morbidities requiring supervision/potential complications: pain mgt, htn, wound care 3. Due to bladder management, bowel management, safety, skin/wound care, disease management, medication administration, pain management and patient education, does the patient require 24 hr/day rehab nursing? Yes 4. Does the patient require coordinated care of a physician, rehab nurse, PT (1-2 hrs/day, 5 days/week) and OT (1-2 hrs/day, 5 days/week) to address physical and functional deficits in the context of the above medical diagnosis(es)? Yes Addressing deficits in the following areas: balance, endurance, locomotion, strength, transferring, bowel/bladder control, bathing, dressing, feeding, grooming, toileting and psychosocial  support 5. Can the patient actively participate in an intensive therapy program of at least 3 hrs of therapy per day at least 5 days per week? Yes 6. The potential for patient to make measurable gains while on inpatient rehab is excellent 7. Anticipated functional outcomes upon discharge from inpatient rehab are supervision  with PT, supervision with OT, n/a with SLP. 8. Estimated rehab length of stay to reach the above functional goals is: 10-14 days 9. Does the patient have adequate social supports to accommodate these discharge functional goals? Yes 10. Anticipated D/C setting: Home 11. Anticipated post D/C treatments: HH therapy and Outpatient therapy 12. Overall Rehab/Functional Prognosis: excellent  RECOMMENDATIONS: This patient's condition is appropriate for continued rehabilitative care in the following setting: CIR Patient has agreed to participate in recommended program. Yes Note that insurance prior authorization may be required for reimbursement for recommended care.  Comment: Rehab Admissions Coordinator to follow up.  Thanks,  Meredith Staggers, MD, [facility]rown     01/09/2014

## 2014-01-09 NOTE — Progress Notes (Addendum)
Patient is complaining of diarrhea after receiving enema, spoke with Dr. Annette Stable, was told to let diarrhea run its course after enema. Patient and wife complaining of patient being in pain all day and also that he has been sick on his stomach all day and nauseous.  Report per nurse has been opposite stating that patient had better pain control and that patient has been sleepy and drowsy during pain assessment. Patient's wife has been out of room several times requesting only Imodium, no request for pain medication nor nausea medication. Assessed pain and gave patient PO dilaudid 4 mg with Tylenol 650 mg to relieve pain for patient. Patient's wife complaining of patient not receiving Dilaudid IV. Notified MD and charge nurse. Charge Nurse to  Speak with patient and wife.  No orders received and will speak with Unit Manager about issues in am. Will continue to monitor.

## 2014-01-09 NOTE — Progress Notes (Signed)
Better, more awake. No weakness. Had a bm. Wound dry. Bladder working well. Rehabilitation to see

## 2014-01-09 NOTE — Progress Notes (Signed)
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  s/p bilaterally L3 laminectomy.  Foraminotomy at L3-4.  L4 laminectomy with facetectomy.  Bilaterally L4-L5 diskectomy medially, L45 PLA    PT Comments    Slow progress.  Pt has little energy and decr tolerance for activity.  He and his wife have received extensive education, but need more reinforcement.  Pt could benefit from the intensity of CIR  Follow Up Recommendations  CIR;Other (comment)     Equipment Recommendations  None recommended by PT    Recommendations for Other Services Rehab consult     Precautions / Restrictions Precautions Precautions: Back;Fall Precaution Booklet Issued: Yes (comment) Precaution Comments: Educated on precautions Required Braces or Orthoses: Spinal Brace Spinal Brace: Lumbar corset;Applied in sitting position Restrictions Weight Bearing Restrictions: No    Mobility  Bed Mobility Overal bed mobility: Needs Assistance Bed Mobility: Rolling;Sidelying to Sit Rolling: Min assist Sidelying to sit: Mod assist     Sit to sidelying: Mod assist General bed mobility comments: cuing for technique and truncal assist for roll and to sit EOB.   Transfers Overall transfer level: Needs assistance Equipment used: Rolling walker (2 wheeled);None Transfers: Sit to/from Stand Sit to Stand: Mod assist Stand pivot transfers: Min assist;From elevated surface       General transfer comment: Varying levels of assist depending on height of surface.  Mod A for sit to stand from bed.   Ambulation/Gait Ambulation/Gait assistance: Min assist Ambulation Distance (Feet): 60 Feet (x2 with sitting rest in between) Assistive device: Rolling walker (2 wheeled) Gait Pattern/deviations: Step-through pattern;Decreased step length - right;Decreased  step length - left;Decreased stride length;Trunk flexed Gait velocity: slower, minimal increase in speed to command Gait velocity interpretation: Below normal speed for age/gender General Gait Details: short equal steps with flexed posture.  Little propulsion or forward momentum   Financial trader Rankin (Stroke Patients Only)       Balance Overall balance assessment: Needs assistance Sitting-balance support: Feet supported;Single extremity supported Sitting balance-Leahy Scale: Poor Sitting balance - Comments: lists L with slightest challenge   Standing balance support: During functional activity;Bilateral upper extremity supported Standing balance-Leahy Scale: Poor                      Cognition Arousal/Alertness: Awake/alert Behavior During Therapy: Flat affect Overall Cognitive Status: Within Functional Limits for tasks assessed                      Exercises      General Comments        Pertinent Vitals/Pain     Home Living Family/patient expects to be discharged to:: Private residence Living Arrangements: Spouse/significant other   Type of Home: House Home Access: Stairs to enter Entrance Stairs-Rails: None Home Layout: One level Home Equipment: Environmental consultant - 2 wheels;Cane - single point;Bedside commode;Shower seat;Wheelchair - Press photographer      Prior Function Level of Independence: Independent with assistive device(s)          PT Goals (current goals can now be found in the care plan section) Acute Rehab PT Goals Patient Stated Goal: wants to be able to do everything himself PT Goal Formulation: With patient Time For Goal Achievement: 01/22/14 Potential to Achieve Goals:  Good Progress towards PT goals: Progressing toward goals    Frequency  Min 5X/week    PT Plan Current plan remains appropriate    Co-evaluation             End of Session Equipment Utilized During  Treatment: Back brace Activity Tolerance: Patient tolerated treatment well;Patient limited by pain;Patient limited by fatigue Patient left: in chair;with call bell/phone within reach;with family/visitor present     Time: 1220-1245 PT Time Calculation (min): 25 min  Charges:  $Gait Training: 8-22 mins $Therapeutic Activity: 8-22 mins                    G CodesTessie Fass [name] 01/09/2014, 12:56 PM 01/09/2014  [name], [place] [phone]  (pager)

## 2014-01-09 NOTE — Evaluation (Addendum)
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  s/p bilaterally L3 laminectomy.  Foraminotomy at L3-4.  L4 laminectomy with facetectomy.  Bilaterally L4-L5 diskectomy medially, L45 PLA   Clinical Impression   Pt presents with below problem list. Pt independent with ADLs, PTA. Feel pt will benefit from acute OT to increase independence prior to d/c.     Follow Up Recommendations  CIR    Equipment Recommendations   (AE)    Recommendations for Other Services       Precautions / Restrictions Precautions Precautions: Back;Fall Precaution Booklet Issued: Yes (comment) Precaution Comments: Educated on precautions Required Braces or Orthoses: Spinal Brace Spinal Brace: Lumbar corset;Applied in sitting position Restrictions Weight Bearing Restrictions: No      Mobility Bed Mobility Overal bed mobility: Needs Assistance Bed Mobility: Rolling;Sidelying to Sit;Sit to Sidelying Rolling: Min assist;Min guard Sidelying to sit: Mod assist     Sit to sidelying: Mod assist General bed mobility comments: Assisted with LE's when going to sidelying position. Assisted with trunk when going to sitting position.  Transfers Overall transfer level: Needs assistance Equipment used: Rolling walker (2 wheeled);None Transfers: Sit to/from [facility] Sit to Stand: Max assist;Min assist;Mod assist Stand pivot transfers: Min assist;From elevated surface       General transfer comment: Varying levels of assist. Mod A/Max A for sit to stand from bed.     Balance                                            ADL Overall ADL's : Needs assistance/impaired Eating/Feeding: Independent;Sitting   Grooming: Wash/dry face;Sitting;Set up;Supervision/safety   Upper Body Bathing: Sitting;Set  up;Supervision/ safety   Lower Body Bathing: Sit to/from stand;Moderate assistance   Upper Body Dressing : Minimal assistance;Sitting (back brace)   Lower Body Dressing: Moderate assistance;With adaptive equipment;Sit to/from stand   Toilet Transfer: Minimal assistance;Stand-pivot   Toileting- Clothing Manipulation and Hygiene: Moderate assistance (standing)       Functional mobility during ADLs: Minimal assistance;Rolling walker General ADL Comments: Educated on use of cup for teeth care. Educated on AE for LB ADLs and pt practiced with reacher and sockaid.  Educated on toilet aid for hygiene if needed. Pt washed peri area and tops of legs due to urinating some on floor.  Educated on safety with ADLs.     Vision                     Perception     Praxis      Pertinent Vitals/Pain Pain 5/10. Increased activity and repositioned.      Hand Dominance Right   Extremity/Trunk Assessment Upper Extremity Assessment Upper Extremity Assessment: Generalized weakness   Lower Extremity Assessment Lower Extremity Assessment: Generalized weakness       Communication Communication Communication: No difficulties   Cognition Arousal/Alertness: Awake/alert Behavior During Therapy: Flat affect Overall Cognitive Status: Within Functional Limits for tasks assessed                     General Comments       Exercises       Shoulder Instructions      Home Living Family/patient expects to be discharged to:: Private residence Living  Arrangements: Spouse/significant other   Type of Home: House Home Access: Stairs to enter [employer] of Steps: 2 Entrance Stairs-Rails: None Home Layout: One level     Bathroom Shower/Tub: Occupational psychologist: Handicapped height     Home Equipment: Environmental consultant - 2 wheels;Cane - single point;Bedside commode;Shower seat;Wheelchair - Press photographer          Prior Functioning/Environment Level  of Independence: Independent with assistive device(s)             OT Diagnosis: Generalized weakness;Acute pain   OT Problem List: Decreased strength;Decreased activity tolerance;Decreased range of motion;Impaired balance (sitting and/or standing);Decreased knowledge of use of DME or AE;Decreased knowledge of precautions;Pain   OT Treatment/Interventions: Self-care/ADL training;DME and/or AE instruction;Therapeutic activities;Patient/family education;Balance training    OT Goals(Current goals can be found in the care plan section) Acute Rehab OT Goals Patient Stated Goal: wants to be able to do everything himself OT Goal Formulation: With patient Time For Goal Achievement: 01/16/14 Potential to Achieve Goals: Good ADL Goals Pt Will Perform Grooming: with supervision;standing Pt Will Perform Upper Body Dressing: with supervision;sitting Pt Will Perform Lower Body Dressing: with supervision;with adaptive equipment;sit to/from stand Pt Will Transfer to Toilet: with supervision;ambulating (3 in 1 over commode) Pt Will Perform Toileting - Clothing Manipulation and hygiene: with supervision;sit to/from stand Additional ADL Goal #1: Pt will perform bed mobility at supervision level as precursor for ADLs.  Additional ADL Goal #2: Pt will independently verbalize 3/3 back precautions.  OT Frequency: Min 2X/week   Barriers to D/C:            Co-evaluation              End of Session Equipment Utilized During Treatment: Gait belt;Rolling walker;Back brace Nurse Communication: Mobility status;Other (comment) (pain level)  Activity Tolerance: Patient tolerated treatment well Patient left: in bed;with call bell/phone within reach;with bed alarm set;with family/visitor present   Time: 5366-4403 OT Time Calculation (min): 34 min Charges:  OT General Charges $OT Visit: 1 Procedure OT Evaluation $Initial OT Evaluation Tier I: 1 Procedure OT Treatments $Self Care/Home Management :  8-22 mins  G-Codes:    Benito Mccreedy OTR/L [phone] 01/09/2014, 11:40 AM

## 2014-01-10 LAB — CBC WITH DIFFERENTIAL/PLATELET
Basophils Absolute: 0 10*3/uL (ref 0.0–0.1)
Basophils Relative: 0 % (ref 0–1)
Eosinophils Absolute: 0.4 10*3/uL (ref 0.0–0.7)
Eosinophils Relative: 4 % (ref 0–5)
HEMATOCRIT: 38.9 % — AB (ref 39.0–52.0)
HEMOGLOBIN: 12.8 g/dL — AB (ref 13.0–17.0)
LYMPHS PCT: 7 % — AB (ref 12–46)
Lymphs Abs: 0.8 10*3/uL (ref 0.7–4.0)
MCH: 30.3 pg (ref 26.0–34.0)
MCHC: 32.9 g/dL (ref 30.0–36.0)
MCV: 92.2 fL (ref 78.0–100.0)
MONO ABS: 1.4 10*3/uL — AB (ref 0.1–1.0)
MONOS PCT: 13 % — AB (ref 3–12)
NEUTROS ABS: 8.5 10*3/uL — AB (ref 1.7–7.7)
Neutrophils Relative %: 76 % (ref 43–77)
Platelets: 417 10*3/uL — ABNORMAL HIGH (ref 150–400)
RBC: 4.22 MIL/uL (ref 4.22–5.81)
RDW: 14 % (ref 11.5–15.5)
WBC: 11.2 10*3/uL — AB (ref 4.0–10.5)

## 2014-01-10 MED ORDER — OXYCODONE HCL 5 MG PO TABS
10.0000 mg | ORAL_TABLET | ORAL | Status: DC | PRN
  Administered 2014-01-10 – 2014-01-11 (×4): 10 mg via ORAL
  Filled 2014-01-10 (×4): qty 2

## 2014-01-10 NOTE — Anesthesia Postprocedure Evaluation (Signed)
Anesthesia Post Note  Patient: [name]  Procedure(s) Performed: Procedure(s) (LRB): POSTERIOR LUMBAR FUSION 1 LEVEL (N/A)  Anesthesia type: General  Patient location: PACU  Post pain: Pain level controlled and Adequate analgesia  Post assessment: Post-op Vital signs reviewed, Patient's Cardiovascular Status Stable, Respiratory Function Stable, Patent Airway and Pain level controlled  Last Vitals:  Filed Vitals:   01/10/14 0656  BP: 144/69  Pulse: 93  Temp: 36.6 C  Resp: 16    Post vital signs: Reviewed and stable  Level of consciousness: awake, alert  and oriented  Complications: No apparent anesthesia complications

## 2014-01-10 NOTE — Progress Notes (Signed)
Pt c/o severe lower back pain medicated with 1 mg dilaudid  at 0932 pt verbalized some relief of pain rated 5/10 . Ambulated about 400 feet with PT and back in the chair.  C/o diarrhea post enema 01/09/14 . Rn explain to pt and wife it's expected enema   but  will monitor and report if not controlled. Pt just had a smear and x 1 this shift .

## 2014-01-10 NOTE — Progress Notes (Signed)
Placed patient on home CPAP for the night. Sp02 at this time is 94%,will continue to monitor patient

## 2014-01-10 NOTE — Progress Notes (Signed)
Pt bowel movement  and pain improved  Lab work okay no acute distress this pm .

## 2014-01-10 NOTE — Progress Notes (Signed)
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  s/p bilaterally L3 laminectomy.  Foraminotomy at L3-4.  L4 laminectomy with facetectomy.  Bilaterally L4-L5 diskectomy medially, L45 PLA    PT Comments    Pt starting to make steady progress, but limited by disproportionate back pain.  Family and pt reinforced in all back care and precautions and verbalized understanding.  Pt/Family also verbalized my expectations for walking with staff and time to spend [place].  Follow Up Recommendations  CIR;Other (comment)     Equipment Recommendations  None recommended by PT    Recommendations for Other Services Rehab consult     Precautions / Restrictions Precautions Precautions: Back;Fall Precaution Booklet Issued: Yes (comment) Precaution Comments: Educated on precautions Required Braces or Orthoses: Spinal Brace Spinal Brace: Lumbar corset;Applied in sitting position Restrictions Weight Bearing Restrictions: No    Mobility  Bed Mobility Overal bed mobility: Needs Assistance Bed Mobility: Rolling;Sidelying to Sit Rolling: Mod assist Sidelying to sit: Mod assist       General bed mobility comments: cuing for technique and truncal assist for roll and to sit EOB.   Transfers Overall transfer level: Needs assistance Equipment used: Rolling walker (2 wheeled);None Transfers: Sit to/from Stand Sit to Stand: Mod assist         General transfer comment: cuing for hand placement and safety.  Pt having a disproportionately hard time coming forward an appropriate amount.  Ambulation/Gait Ambulation/Gait assistance: Min assist Ambulation Distance (Feet): 280 Feet Assistive device: Rolling walker (2 wheeled) Gait Pattern/deviations: Step-through pattern;Decreased step length - right;Decreased step length -  left;Decreased stride length;Trunk flexed Gait velocity: slow and guarded   General Gait Details: short equal steps with flexed posture.    Stairs            Wheelchair Mobility    Modified Rankin (Stroke Patients Only)       Balance     Sitting balance-Leahy Scale: Poor Sitting balance - Comments: lists L with slightest challenge     Standing balance-Leahy Scale: Poor                      Cognition Arousal/Alertness: Awake/alert Behavior During Therapy: Flat affect Overall Cognitive Status: Within Functional Limits for tasks assessed                      Exercises      General Comments        Pertinent Vitals/Pain     Home Living                      Prior Function            PT Goals (current goals can now be found in the care plan section) Acute Rehab PT Goals Patient Stated Goal: wants to be able to do everything himself PT Goal Formulation: With patient Time For Goal Achievement: 01/22/14 Potential to Achieve Goals: Good Progress towards PT goals: Progressing toward goals    Frequency  Min 5X/week    PT Plan Current plan remains appropriate    Co-evaluation             End of Session Equipment Utilized During Treatment: Back brace Activity Tolerance: Patient tolerated treatment well;Patient limited by pain Patient left: in chair;with call bell/phone within reach;with family/visitor present  Time: 0539-7673 PT Time Calculation (min): 24 min  Charges:  $Gait Training: 8-22 mins $Self Care/Home Management: 8-22                    G CodesTessie Fass Giulliana Mcroberts 01/10/2014, 12:26 PM 01/10/2014  [name], [place] [phone]  (pager)

## 2014-01-10 NOTE — Progress Notes (Signed)
Patient ID: [name], male   DOB: September 29, 1940, 73 y.o.   MRN: [id number] Diarrhea, no weakness. Wound dry. No weakness. C/o incisional pain. See orders. Rehabilitation to transfer once is ok by insurance

## 2014-01-10 NOTE — Progress Notes (Signed)
I will follow up tomorrow. Spoke to wife at bedside. Diarrhea and pain issues today. 014-1030

## 2014-01-10 NOTE — Progress Notes (Signed)
I await insurance approval for a possible admission to inpt rehab today pending their decision. [phone]

## 2014-01-10 NOTE — Progress Notes (Signed)
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  s/p bilaterally L3 laminectomy.  Foraminotomy at L3-4.  L4 laminectomy with facetectomy.  Bilaterally L4-L5 diskectomy medially, L45 PLA    PT Comments    Slow to progress  Follow Up Recommendations  CIR;Other (comment)     Equipment Recommendations  None recommended by PT    Recommendations for Other Services Rehab consult     Precautions / Restrictions Precautions Precautions: Back;Fall Precaution Booklet Issued: Yes (comment) Precaution Comments: Educated on precautions Required Braces or Orthoses: Spinal Brace Spinal Brace: Lumbar corset;Applied in sitting position Restrictions Weight Bearing Restrictions: No    Mobility  Bed Mobility Overal bed mobility: Needs Assistance Bed Mobility: Rolling;Sidelying to Sit Rolling: Min assist Sidelying to sit: Mod assist (and heavy use of the rail)       General bed mobility comments: cuing for technique and truncal assist for roll and to sit EOB.   Transfers Overall transfer level: Needs assistance Equipment used: Rolling walker (2 wheeled);None   Sit to Stand: Mod assist         General transfer comment: cuing for hand placement and safety.  Pt having a disproportionately hard time coming forward an appropriate amount.  Ambulation/Gait Ambulation/Gait assistance: Min assist Ambulation Distance (Feet): 360 Feet Assistive device: Rolling walker (2 wheeled) Gait Pattern/deviations: Step-through pattern Gait velocity: slow and guarded   General Gait Details: short equal steps with flexed posture.    Stairs            Wheelchair Mobility    Modified Rankin (Stroke Patients Only)       Balance     Sitting balance-Leahy Scale: Poor Sitting balance - Comments: lists L with slightest  challenge     Standing balance-Leahy Scale: Poor                      Cognition Arousal/Alertness: Awake/alert Behavior During Therapy: Flat affect (a little delerious sounding) Overall Cognitive Status: Within Functional Limits for tasks assessed                      Exercises      General Comments        Pertinent Vitals/Pain     Home Living                      Prior Function            PT Goals (current goals can now be found in the care plan section) Acute Rehab PT Goals Patient Stated Goal: wants to be able to do everything himself PT Goal Formulation: With patient Time For Goal Achievement: 01/22/14 Potential to Achieve Goals: Good    Frequency  Min 5X/week    PT Plan Current plan remains appropriate    Co-evaluation             End of Session Equipment Utilized During Treatment: Back brace Activity Tolerance: Patient tolerated treatment well;Patient limited by pain Patient left: in chair;with call bell/phone within reach;with family/visitor present     Time: 4270-6237 PT Time Calculation (min): 33 min  Charges:  $Gait Training: 8-22 mins $Therapeutic Activity: 8-22 mins                    G Codes:      Chrissie Noa  V Deaundre [place] 01/10/2014, 5:31 PM 01/10/2014  [name], PT [phone]  (pager)

## 2014-01-11 ENCOUNTER — Inpatient Hospital Stay (HOSPITAL_COMMUNITY)
Admission: RE | Admit: 2014-01-11 | Discharge: 2014-01-19 | DRG: 946 | Disposition: A | Discharge status: Home / Self Care | Payer: Medicare Other | Source: Intra-hospital | Attending: Physical Medicine & Rehabilitation | Admitting: Physical Medicine & Rehabilitation

## 2014-01-11 ENCOUNTER — Encounter (HOSPITAL_COMMUNITY): Payer: Self-pay | Admitting: *Deleted

## 2014-01-11 DIAGNOSIS — Q762 Congenital spondylolisthesis: Secondary | ICD-10-CM | POA: Diagnosis not present

## 2014-01-11 DIAGNOSIS — Z5189 Encounter for other specified aftercare: Secondary | ICD-10-CM | POA: Diagnosis present

## 2014-01-11 DIAGNOSIS — M48061 Spinal stenosis, lumbar region without neurogenic claudication: Secondary | ICD-10-CM | POA: Diagnosis present

## 2014-01-11 DIAGNOSIS — M109 Gout, unspecified: Secondary | ICD-10-CM | POA: Diagnosis present

## 2014-01-11 DIAGNOSIS — K5900 Constipation, unspecified: Secondary | ICD-10-CM | POA: Diagnosis present

## 2014-01-11 DIAGNOSIS — Z981 Arthrodesis status: Secondary | ICD-10-CM

## 2014-01-11 DIAGNOSIS — M545 Low back pain, unspecified: Secondary | ICD-10-CM

## 2014-01-11 DIAGNOSIS — R413 Other amnesia: Secondary | ICD-10-CM | POA: Diagnosis present

## 2014-01-11 DIAGNOSIS — I10 Essential (primary) hypertension: Secondary | ICD-10-CM | POA: Diagnosis present

## 2014-01-11 DIAGNOSIS — Z9089 Acquired absence of other organs: Secondary | ICD-10-CM

## 2014-01-11 DIAGNOSIS — N400 Enlarged prostate without lower urinary tract symptoms: Secondary | ICD-10-CM | POA: Diagnosis present

## 2014-01-11 DIAGNOSIS — R197 Diarrhea, unspecified: Secondary | ICD-10-CM | POA: Diagnosis not present

## 2014-01-11 DIAGNOSIS — G4733 Obstructive sleep apnea (adult) (pediatric): Secondary | ICD-10-CM | POA: Diagnosis present

## 2014-01-11 DIAGNOSIS — T504X5A Adverse effect of drugs affecting uric acid metabolism, initial encounter: Secondary | ICD-10-CM | POA: Diagnosis not present

## 2014-01-11 DIAGNOSIS — IMO0002 Reserved for concepts with insufficient information to code with codable children: Secondary | ICD-10-CM

## 2014-01-11 DIAGNOSIS — Z87442 Personal history of urinary calculi: Secondary | ICD-10-CM

## 2014-01-11 DIAGNOSIS — Z79899 Other long term (current) drug therapy: Secondary | ICD-10-CM

## 2014-01-11 DIAGNOSIS — H53149 Visual discomfort, unspecified: Secondary | ICD-10-CM | POA: Diagnosis present

## 2014-01-11 DIAGNOSIS — M4316 Spondylolisthesis, lumbar region: Secondary | ICD-10-CM

## 2014-01-11 DIAGNOSIS — M5410 Radiculopathy, site unspecified: Secondary | ICD-10-CM | POA: Diagnosis present

## 2014-01-11 LAB — CBC
HCT: 37.5 % — ABNORMAL LOW (ref 39.0–52.0)
HEMOGLOBIN: 12.4 g/dL — AB (ref 13.0–17.0)
MCH: 30.5 pg (ref 26.0–34.0)
MCHC: 33.1 g/dL (ref 30.0–36.0)
MCV: 92.1 fL (ref 78.0–100.0)
Platelets: 395 10*3/uL (ref 150–400)
RBC: 4.07 MIL/uL — ABNORMAL LOW (ref 4.22–5.81)
RDW: 13.8 % (ref 11.5–15.5)
WBC: 12 10*3/uL — ABNORMAL HIGH (ref 4.0–10.5)

## 2014-01-11 LAB — COMPREHENSIVE METABOLIC PANEL
ALT: 100 U/L — ABNORMAL HIGH (ref 0–53)
AST: 93 U/L — AB (ref 0–37)
Albumin: 2.7 g/dL — ABNORMAL LOW (ref 3.5–5.2)
Alkaline Phosphatase: 126 U/L — ABNORMAL HIGH (ref 39–117)
BILIRUBIN TOTAL: 0.2 mg/dL — AB (ref 0.3–1.2)
BUN: 47 mg/dL — AB (ref 6–23)
CHLORIDE: 108 meq/L (ref 96–112)
CO2: 22 mEq/L (ref 19–32)
CREATININE: 1.35 mg/dL (ref 0.50–1.35)
Calcium: 10.7 mg/dL — ABNORMAL HIGH (ref 8.4–10.5)
GFR calc Af Amer: 59 mL/min — ABNORMAL LOW (ref >90)
GFR calc non Af Amer: 50 mL/min — ABNORMAL LOW (ref >90)
Glucose, Bld: 111 mg/dL — ABNORMAL HIGH (ref 70–99)
Potassium: 4.3 mEq/L (ref 3.7–5.3)
Sodium: 142 mEq/L (ref 137–147)
Total Protein: 6.8 g/dL (ref 6.0–8.3)

## 2014-01-11 LAB — CREATININE, SERUM
CREATININE: 1.25 mg/dL (ref 0.50–1.35)
GFR calc Af Amer: 64 mL/min — ABNORMAL LOW (ref >90)
GFR, EST NON AFRICAN AMERICAN: 55 mL/min — AB (ref >90)

## 2014-01-11 MED ORDER — HYDROCODONE-ACETAMINOPHEN 10-325 MG PO TABS
1.0000 | ORAL_TABLET | ORAL | Status: DC | PRN
  Administered 2014-01-11: 1 via ORAL
  Filled 2014-01-11: qty 1

## 2014-01-11 MED ORDER — AMLODIPINE BESYLATE 5 MG PO TABS
5.0000 mg | ORAL_TABLET | Freq: Every day | ORAL | Status: DC
Start: 2014-01-12 — End: 2014-01-19
  Administered 2014-01-12 – 2014-01-19 (×8): 5 mg via ORAL
  Filled 2014-01-11 (×9): qty 1

## 2014-01-11 MED ORDER — FEBUXOSTAT 40 MG PO TABS
40.0000 mg | ORAL_TABLET | Freq: Every day | ORAL | Status: DC | PRN
  Filled 2014-01-11: qty 1

## 2014-01-11 MED ORDER — METHOCARBAMOL 500 MG PO TABS
500.0000 mg | ORAL_TABLET | Freq: Four times a day (QID) | ORAL | Status: DC | PRN
  Administered 2014-01-12 – 2014-01-18 (×4): 500 mg via ORAL
  Filled 2014-01-11 (×5): qty 1

## 2014-01-11 MED ORDER — HEPARIN SODIUM (PORCINE) 5000 UNIT/ML IJ SOLN
5000.0000 [IU] | Freq: Three times a day (TID) | INTRAMUSCULAR | Status: DC
  Administered 2014-01-11 – 2014-01-19 (×23): 5000 [IU] via SUBCUTANEOUS
  Filled 2014-01-11 (×26): qty 1

## 2014-01-11 MED ORDER — HEPARIN SODIUM (PORCINE) 5000 UNIT/ML IJ SOLN: 5000.0000 [IU] | Freq: Three times a day (TID) | INTRAMUSCULAR | Status: DC

## 2014-01-11 MED ORDER — HYDROCODONE-ACETAMINOPHEN 10-325 MG PO TABS
1.0000 | ORAL_TABLET | ORAL | Status: DC | PRN
  Administered 2014-01-11 – 2014-01-12 (×3): 1 via ORAL
  Filled 2014-01-11 (×3): qty 1

## 2014-01-11 MED ORDER — ONDANSETRON HCL 4 MG/2ML IJ SOLN: 4.0000 mg | Freq: Four times a day (QID) | INTRAMUSCULAR | Status: DC | PRN

## 2014-01-11 MED ORDER — LOPERAMIDE HCL 2 MG PO CAPS
4.0000 mg | ORAL_CAPSULE | ORAL | Status: DC | PRN
  Administered 2014-01-11: 4 mg via ORAL
  Filled 2014-01-11 (×2): qty 2

## 2014-01-11 MED ORDER — ONDANSETRON HCL 4 MG PO TABS
4.0000 mg | ORAL_TABLET | Freq: Four times a day (QID) | ORAL | Status: DC | PRN
  Administered 2014-01-14 (×2): 4 mg via ORAL
  Filled 2014-01-11 (×2): qty 1

## 2014-01-11 MED ORDER — BISACODYL 5 MG PO TBEC: 5.0000 mg | DELAYED_RELEASE_TABLET | Freq: Every day | ORAL | Status: DC | PRN

## 2014-01-11 MED ORDER — SORBITOL 70 % SOLN: 30.0000 mL | Freq: Every day | Status: DC | PRN

## 2014-01-11 MED ORDER — ACETAMINOPHEN 325 MG PO TABS
325.0000 mg | ORAL_TABLET | ORAL | Status: DC | PRN
  Administered 2014-01-17: 650 mg via ORAL
  Filled 2014-01-11: qty 2

## 2014-01-11 MED ORDER — COLCHICINE 0.6 MG PO TABS
0.6000 mg | ORAL_TABLET | Freq: Three times a day (TID) | ORAL | Status: DC
  Administered 2014-01-11: 0.6 mg via ORAL
  Filled 2014-01-11 (×5): qty 1

## 2014-01-11 MED ORDER — TAMSULOSIN HCL 0.4 MG PO CAPS
0.4000 mg | ORAL_CAPSULE | Freq: Every day | ORAL | Status: DC
  Administered 2014-01-12 – 2014-01-19 (×8): 0.4 mg via ORAL
  Filled 2014-01-11 (×9): qty 1

## 2014-01-11 NOTE — Progress Notes (Signed)
I met with son and pt at bedside. I will arrange for admission to inpt rehab today. [phone]

## 2014-01-11 NOTE — Discharge Summary (Signed)
Physician Discharge Summary  Patient ID: [name] MRN: [id number] DOB/AGE: 74/07/1941 74 y.o.  Admit date: 01/05/2014 Discharge date: 01/11/2014  Admission Diagnoses:lumbar spondylolisthesis , stenosis Sleep apnea Discharge Diagnoses:  Active Problems:   Spondylolisthesis of lumbar region neurogenic claudication Duratotomy.  Sleep apnea Discharged Condition:incisional pain  Hospital Course: surgery  Consults: rehabilitation medicine  Significant Diagnostic Studies: myelogram  Treatments: decompression and fusion  Discharge Exam: Blood pressure 126/65, pulse 85, temperature 97.9 F (36.6 C), temperature source Axillary, resp. rate 22, height 5\' 9"  (1.753 m), weight 91.354 kg (201 lb 6.4 oz), SpO2 97.00%. Diarrhea. No weakness. Wound dry  Disposition: patient to go ahead to transfer to the rehabilitation unit     Medication List    ASK your doctor about these medications       amLODipine 5 MG tablet  Commonly known as:  NORVASC  Take 5 mg by mouth daily.     colchicine 0.6 MG tablet  Take 0.6 mg by mouth 3 (three) times daily.     multivitamin with minerals Tabs tablet  Take 1 tablet by mouth daily.     oxyCODONE 15 MG immediate release tablet  Commonly known as:  ROXICODONE  Take 15 mg by mouth every 4 (four) hours as needed for pain.     ULORIC 40 MG tablet  Generic drug:  febuxostat  Take 40 mg by mouth daily. PRN Gout flares     vitamin C 1000 MG tablet  Take 1,000 mg by mouth daily.         Signed: Floyce Stakes 01/11/2014, 9:39 AM

## 2014-01-11 NOTE — Progress Notes (Signed)
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  s/p bilaterally L3 laminectomy.  Foraminotomy at L3-4.  L4 laminectomy with facetectomy.  Bilaterally L4-L5 diskectomy medially, L45 PLA    PT Comments    Progressing slowly.  Ready for intensity of CIR  Follow Up Recommendations  CIR;Other (comment)     Equipment Recommendations  None recommended by PT    Recommendations for Other Services Rehab consult     Precautions / Restrictions Precautions Precautions: Back;Fall Precaution Booklet Issued: Yes (comment) Precaution Comments: Educated on precautions Required Braces or Orthoses: Spinal Brace Spinal Brace: Lumbar corset;Applied in sitting position Restrictions Weight Bearing Restrictions: No    Mobility  Bed Mobility       Sidelying to sit:  ( )          Transfers Overall transfer level: Needs assistance Equipment used: Rolling walker (2 wheeled);None Transfers: Sit to/from Stand Sit to Stand: Min assist         General transfer comment: cuing for hand placement and safety.   Ambulation/Gait Ambulation/Gait assistance: Min assist Ambulation Distance (Feet): 360 Feet Assistive device: Rolling walker (2 wheeled) Gait Pattern/deviations: Step-through pattern Gait velocity: slow, but able to speed up and sustain to command   General Gait Details: steady gait, a bit guarded with cuing needed for postural check   Stairs Stairs: Yes Stairs assistance: Min assist Stair Management: One rail Right;Alternating pattern;Forwards Number of Stairs: 5    Wheelchair Mobility    Modified Rankin (Stroke Patients Only)       Balance   Sitting-balance support: Feet supported;No upper extremity supported Sitting balance-Leahy Scale: Fair Sitting balance - Comments: still lists left, but can  correct today     Standing balance-Leahy Scale: Poor                      Cognition Arousal/Alertness: Awake/alert Behavior During Therapy: Flat affect (a little delerious sounding) Overall Cognitive Status: Within Functional Limits for tasks assessed                      Exercises      General Comments        Pertinent Vitals/Pain     Home Living     Available Help at Discharge: Family;Available 24 hours/day                Prior Function            PT Goals (current goals can now be found in the care plan section) Acute Rehab PT Goals Patient Stated Goal: wants to be able to do everything himself PT Goal Formulation: With patient Time For Goal Achievement: 01/22/14 Potential to Achieve Goals: Good Progress towards PT goals: Progressing toward goals    Frequency  Min 5X/week    PT Plan Current plan remains appropriate    Co-evaluation             End of Session Equipment Utilized During Treatment: Back brace Activity Tolerance: Patient tolerated treatment well;Patient limited by pain Patient left: in chair;with call bell/phone within reach;with family/visitor present     Time: 1145-1210 PT Time Calculation (min): 25 min  Charges:  $Gait Training: 8-22 mins $Therapeutic Activity: 8-22 mins                    G  Codes:      [name] Mohd Clemons 01/11/2014, 3:46 PM 01/11/2014  [name], [place] [phone]  (pager)

## 2014-01-11 NOTE — H&P (Signed)
Physical Medicine and Rehabilitation Admission H&P  No chief complaint on file.  :  Chief complaint: Back pain  HPI: [name] is a 73 y.o. right-handed male with history of hypertension as well as sleep apnea and gout. Patient was independent prior to admission living with his wife. Admitted 01/04/2014 with progressive low back pain that has progressed over the last 2 years. No relief with conservative care including epidural injections. 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 Underwent bilateral L3 laminectomy, foraminotomy bilateral L4-5 discectomy posterior lateral arthrodesis 01/06/2014 per Dr. Joya Salm. Postoperative pain management. Back brace when out of bed applied in sitting position. Subcutaneous heparin for DVT prophylaxis. Physical and occupational therapy evaluations completed 01/08/2014 with recommendations for physical medicine rehabilitation consult. Patient was admitted for comprehensive rehabilitation program   Patient's measured complaint is poor appetite. He is trying to avoid purine rich foods because of his gout   ROS Review of Systems  Eyes: Positive for photophobia.  Gastrointestinal: Positive for constipation.  Musculoskeletal: Positive for back pain, joint pain and myalgias.  All other systems reviewed and are negative    Past Medical History    Diagnosis  Date    .  Hypertension     .  Sleep apnea       cpap 7 yrs    .  History of kidney stones     .  Arthritis     .  Gout     .  Cancer  01      colon       Past Surgical History    Procedure  Laterality  Date    .  Colon surgery   01      ca    .  Tonsillectomy      .  Appendectomy       History reviewed. No pertinent family history.  Social History: reports that he has never smoked. He has never used smokeless tobacco. He reports that he does not drink alcohol or use illicit drugs.  Allergies: No Known  Allergies    Medications Prior to Admission    Medication  Sig  Dispense  Refill    .  amLODipine (NORVASC) 5 MG tablet  Take 5 mg by mouth daily.      .  Ascorbic Acid (VITAMIN C) 1000 MG tablet  Take 1,000 mg by mouth daily.      .  colchicine 0.6 MG tablet  Take 0.6 mg by mouth 3 (three) times daily.      .  febuxostat (ULORIC) 40 MG tablet  Take 40 mg by mouth daily. PRN Gout flares      .  Multiple Vitamin (MULTIVITAMIN WITH MINERALS) TABS tablet  Take 1 tablet by mouth daily.      Marland Kitchen  oxyCODONE (ROXICODONE) 15 MG immediate release tablet  Take 15 mg by mouth every 4 (four) hours as needed for pain.       Home:  Home Living  Family/patient expects to be discharged to:: Private residence  Living Arrangements: Spouse/significant other  Type of Home: House  Home Access: Stairs to enter  [employer] of Steps: 2  Entrance Stairs-Rails: None  Home Layout: One level  Home Equipment: Environmental consultant - 2 wheels;Cane - single point;Bedside commode;Shower seat;Wheelchair - Museum/gallery exhibitions officer History:  Prior Function  Level of Independence: Independent with assistive device(s)  Functional Status:  Mobility:  Bed Mobility  Overal  bed mobility: Needs Assistance  Bed Mobility: Rolling;Sidelying to Sit  Rolling: Min assist  Sidelying to sit: Mod assist  Sit to sidelying: Mod assist  General bed mobility comments: cuing for technique and truncal assist for roll and to sit EOB.  Transfers  Overall transfer level: Needs assistance  Equipment used: Rolling walker (2 wheeled);None  Transfers: Sit to/from Stand  Sit to Stand: Mod assist  Stand pivot transfers: Min assist;From elevated surface  General transfer comment: Varying levels of assist depending on height of surface. Mod A for sit to stand from bed.  Ambulation/Gait  Ambulation/Gait assistance: Min assist  Ambulation Distance (Feet): 60 Feet (x2 with sitting rest in between)  Assistive device: Rolling walker (2  wheeled)  Gait Pattern/deviations: Step-through pattern;Decreased step length - right;Decreased step length - left;Decreased stride length;Trunk flexed  Gait velocity: slower, minimal increase in speed to command  Gait velocity interpretation: Below normal speed for age/gender  General Gait Details: short equal steps with flexed posture. Little propulsion or forward momentum   ADL:   Cognition:  Cognition  Overall Cognitive Status: Within Functional Limits for tasks assessed  Orientation Level: Oriented X4  Cognition  Arousal/Alertness: Awake/alert  Behavior During Therapy: Flat affect  Overall Cognitive Status: Within Functional Limits for tasks assessed  Physical Exam:  Blood pressure 120/67, pulse 74, temperature 96.7 F (35.9 C), temperature source Axillary, resp. rate 14, height 5\' 9"  (1.753 m), weight 201 lb 6.4 oz (91.354 kg), SpO2 98.00%.  Physical Exam  Vitals reviewed.  HENT:  Head: Normocephalic.  Eyes: EOM are normal.  Neck: Normal range of motion. Neck supple. No thyromegaly present.  Cardiovascular: Normal rate and regular rhythm.  Respiratory: Effort normal and breath sounds normal. No respiratory distress.  GI: Soft. Bowel sounds are normal. He exhibits no distension.  Neurological:  Patient is a bit lethargic but arousable. He was able to provide his name and age as well as place. He follows simple commands. Needs extra time for orientation. Poor with biographical/historical information. Bilateral deltoid, bicep, tricep, grip 4/5. LE , bilateral hip flexors 3 minus bilaterally knee extensors 4 minus, bilateral ankle dorsiflexors 4 minus Sensory difficult to assess secondary to his slowed mental processing Skin:  Back incision without evidence of drainage sutures intact skin well approximated  Psychiatric:  Flat, fatigued  Musculoskeletal no evidence of joint swelling in the upper or lower extremities no pain with active or passive range of motion in the upper or  lower ext No results found for this or any previous visit (from the past 48 hour(s)).  No results found.  Post Admission Physician Evaluation:  1. Functional deficits secondary to lumbar stenosis status post lumbar decompression surgery. 2. Patient is admitted to receive collaborative, interdisciplinary care between the physiatrist, rehab nursing staff, and therapy team. 3. Patient's level of medical complexity and substantial therapy needs in context of that medical necessity cannot be provided at a lesser intensity of care such as a SNF. 4. Patient has experienced substantial functional loss from his/her baseline which was documented above under the "Functional History" and "Functional Status" headings. Judging by the patient's diagnosis, physical exam, and functional history, the patient has potential for functional progress which will result in measurable gains while on inpatient rehab. These gains will be of substantial and practical use upon discharge in facilitating mobility and self-care at the household level. 5. Physiatrist will provide 24 hour management of medical needs as well as oversight of the therapy plan/treatment and provide guidance as appropriate  regarding the interaction of the two. 6. 24 hour rehab nursing will assist with bladder management, bowel management, safety, skin/wound care, disease management, medication administration, pain management and patient education and help integrate therapy concepts, techniques,education, etc. 7. PT will assess and treat for/with: pre gait, gait training, endurance , safety, equipment, neuromuscular re education. Goals are:  supervision to min assist . 8. OT will assess and treat for/with: ADLs, Cognitive perceptual skills, Neuromuscular re education, safety, endurance, equipment. Goals are:  supervision to min assist ADLs . 9. SLP will assess and treat for/with:  not applicable. Goals are:  not applicable . 10. Case Management and Social  Worker will assess and treat for psychological issues and discharge planning. 11. Team conference will be held weekly to assess progress toward goals and to determine barriers to discharge. 12. Patient will receive at least 3 hours of therapy per day at least 5 days per week. 13. ELOS:  10-14 days   14. Prognosis: good Medical Problem List and Plan:  1. 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 Status post laminectomy, foraminotomies discectomy 01/06/2014  2. DVT Prophylaxis/Anticoagulation: Subcutaneous heparin. Monitor platelet counts and any signs of bleeding  3. Pain Management: Robaxin and hydrocodone as needed. Monitor with increased mobility.  4. Neuropsych: This patient is capable of making decisions on his own behalf.  5. Gout. Colchicine 0.6 mg 3 times a day,Uloric 40 mg daily as needed gout flareup.  6. BPH. Flomax 0.4 mg daily. Check PVRs x3  7. Hypertension. Norvasc 5 mg daily. Blood pressure is under good control and monitored  8. Obstructive sleep apnea. CPAP   Charlett Blake M.D. [facility] FAAPM&R (Sports Med, Neuromuscular Med) Diplomate Am Board of Electrodiagnostic Med   01/11/2014

## 2014-01-11 NOTE — Progress Notes (Signed)
1427 report called off to nurse Natasha in [place]. Pt to be transported off unit via bed with his belongings at side. Will continue to monitor pt till transported to disposition. Francis Gaines Tahjir Silveria RN.

## 2014-01-11 NOTE — Progress Notes (Signed)
I have insurance approval to admit pt to inpt rehab. Await medical clearance to admit today. I will contact Dr. Joya Salm and discuss with pt and his wife. [phone]

## 2014-01-11 NOTE — Progress Notes (Signed)
Asked to check patient's home CPAP machine, wife having trouble setting it up.  Found machine to have water under water chamber in heater unit.  Water also in tubing and nasal prongs.  Dried water in heater and verified no water had migrated into blower unit.  Patient has another tube and nasal prongs, those were put on machine, and machine operation seemed OK.  Applied prongs to patient and restarted machine.  No apparent complications, advised patient/wife to call if any more problems.

## 2014-01-11 NOTE — Progress Notes (Signed)
Pt discharging to CIR today. CSW signing off.   Neesa Knapik, MSW, LCSWA Clinical Social Worker [phone] 

## 2014-01-11 NOTE — PMR Pre-admission (Signed)
PMR Admission Coordinator Pre-Admission Assessment  Patient: [name] is an 73 y.o., male MRN: [id number] DOB: [birth year] Height: 5\' 9"  (175.3 cm) Weight: 91.354 kg (201 lb 6.4 oz)              Insurance Information HMO: yes    PPO:      PCP:      IPA:      80/20:      OTHER:  PRIMARY: Blue Medicare      Policy#: [id number]      Subscriber: pt CM Name: Santiago Glad      Phone#: [phone]     Fax#: [phone] Pre-Cert#: [id number]      Employer: retired Benefits:  Phone #: [phone]     Name: 4/21 Eff. Date: 09/21/13     Deduct: none      Out of Pocket Max: $4500      Life Max: none CIR: $250 copay per day days 1-6 then covers 100%      SNF: no copay days 1-20; $60 copay per day days 21-100 Outpatient: $35 per visit     Co-Pay: no visit limit Home Health: 100%      Co-Pay: none DME: 80%     Co-Pay: 20% Providers: in network  SECONDARY: none        Medicaid Application Date:       Case Manager:  Disability Application Date:       Case Worker:   Emergency Contact Information Contact Information   Name Relation Home Work Mobile   Seguin,Carolyn Spouse [phone]  [phone]     Current Medical History  Patient Admitting Diagnosis: lumbar spondylolisthesis, severe polyarticular gout  History of Present Illness: [name] is a 73 y.o. right-handed male with history of hypertension as well as sleep apnea and gout. Patient was independent prior to admission living with his wife. Admitted 01/04/2014 with progressive low back pain that has progressed over the last 2 years. No relief with conservative care including epidural injections. 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 Underwent bilateral L3 laminectomy, foraminotomy bilateral L4-5 discectomy posterior lateral arthrodesis 01/06/2014 per Dr. Joya Salm. Postoperative pain management. Back brace when out of bed applied in sitting position.  Subcutaneous heparin for DVT prophylaxis.   Past Medical History  Past Medical History  Diagnosis Date  . Hypertension   . Sleep apnea     cpap 7 yrs  . History of kidney stones   . Arthritis   . Gout   . Cancer 01    colon    Family History  family history is not on file.  Prior Rehab/Hospitalizations: none   Current Medications  Current facility-administered medications:0.9 %  sodium chloride infusion, 250 mL, Intravenous, Continuous, Floyce Stakes, MD;  0.9 %  sodium chloride infusion, , Intravenous, Continuous, Floyce Stakes, MD, Last Rate: 75 mL/hr at 01/06/14 2223, 75 mL/hr at 01/06/14 2223;  acetaminophen (TYLENOL) suppository 650 mg, 650 mg, Rectal, Q4H PRN, Floyce Stakes, MD acetaminophen (TYLENOL) tablet 650 mg, 650 mg, Oral, Q4H PRN, Floyce Stakes, MD, 650 mg at 01/11/14 0704;  amLODipine (NORVASC) tablet 5 mg, 5 mg, Oral, Daily, Floyce Stakes, MD, 5 mg at 01/11/14 1001;  bisacodyl (DULCOLAX) EC tablet 5 mg, 5 mg, Oral, Daily PRN, Floyce Stakes, MD, 5 mg at 01/08/14 2154;  colchicine tablet 0.6 mg, 0.6 mg, Oral, TID, Floyce Stakes, MD, 0.6 mg at 01/11/14 1001 diphenhydrAMINE (  BENADRYL) capsule 25 mg, 25 mg, Oral, Q6H PRN, Elaina Hoops, MD, 25 mg at 01/06/14 2115;  febuxostat (ULORIC) tablet 40 mg, 40 mg, Oral, Daily PRN, Floyce Stakes, MD;  heparin injection 5,000 Units, 5,000 Units, Subcutaneous, 3 times per day, Floyce Stakes, MD, 5,000 Units at 01/11/14 0704;  HYDROcodone-acetaminophen (NORCO) 10-325 MG per tablet 1 tablet, 1 tablet, Oral, Q4H PRN, Floyce Stakes, MD lactated ringers infusion, , Intravenous, Continuous, Albertha Ghee, MD, Last Rate: 20 mL/hr at 01/05/14 3154;  loperamide (IMODIUM) capsule 4 mg, 4 mg, Oral, PRN, Floyce Stakes, MD;  menthol-cetylpyridinium (CEPACOL) lozenge 3 mg, 1 lozenge, Oral, PRN, Floyce Stakes, MD;  ondansetron [facility]) injection 4 mg, 4 mg, Intravenous, Q4H PRN, Floyce Stakes, MD, 4 mg at 01/10/14  0943 phenol (CHLORASEPTIC) mouth spray 1 spray, 1 spray, Mouth/Throat, PRN, Floyce Stakes, MD;  sodium chloride 0.9 % injection 3 mL, 3 mL, Intravenous, Q12H, Floyce Stakes, MD, 3 mL at 01/11/14 1002;  sodium chloride 0.9 % injection 3 mL, 3 mL, Intravenous, PRN, Floyce Stakes, MD;  sodium phosphate (FLEET) 7-19 GM/118ML enema 1 enema, 1 enema, Rectal, Daily PRN, Floyce Stakes, MD, 1 enema at 01/09/14 0107 tamsulosin [facility]) capsule 0.4 mg, 0.4 mg, Oral, Daily, Floyce Stakes, MD, 0.4 mg at 01/11/14 1001  Patients Current Diet: Cardiac  Precautions / Restrictions Precautions Precautions: Back;Fall Precaution Booklet Issued: Yes (comment) Precaution Comments: Educated on precautions Spinal Brace: Lumbar corset;Applied in sitting position Restrictions Weight Bearing Restrictions: No   Prior Activity Level Community (5-7x/wk): active and independent  Development worker, international aid / Equipment Home Assistive Devices/Equipment: CPAP;Eyeglasses;Raised toilet seat with rails;Shower chair without back Home Equipment: [place] - 2 wheels;Cane - single point;Bedside commode;Shower seat;Wheelchair - Press photographer  Prior Functional Level Prior Function Level of Independence: Independent with assistive device(s)  Current Functional Level Cognition  Overall Cognitive Status: Within Functional Limits for tasks assessed Orientation Level: Oriented X4    Extremity Assessment (includes Sensation/Coordination)          ADLs  Overall ADL's : Needs assistance/impaired Eating/Feeding: Independent;Sitting Grooming: Wash/dry face;Sitting;Set up;Supervision/safety Upper Body Bathing: Sitting;Set up;Supervision/ safety Lower Body Bathing: Sit to/from stand;Moderate assistance Upper Body Dressing : Minimal assistance;Sitting (back brace) Lower Body Dressing: Moderate assistance;With adaptive equipment;Sit to/from stand Toilet Transfer: Minimal assistance;Stand-pivot Toileting-  Clothing Manipulation and Hygiene: Moderate assistance (standing) Functional mobility during ADLs: Minimal assistance;Rolling walker General ADL Comments: Educated on use of cup for teeth care. Educated on AE for LB ADLs and pt practiced with reacher and sockaid.  Educated on toilet aid for hygiene if needed. Pt washed peri area and tops of legs due to urinating some on floor.  Educated on safety with ADLs.    Mobility  Overal bed mobility: Needs Assistance Bed Mobility: Rolling;Sidelying to Sit Rolling: Min assist Sidelying to sit: Mod assist (and heavy use of the rail) Sit to sidelying: Mod assist General bed mobility comments: cuing for technique and truncal assist for roll and to sit EOB.     Transfers  Overall transfer level: Needs assistance Equipment used: Rolling walker (2 wheeled);None Transfers: Sit to/from Stand Sit to Stand: Mod assist Stand pivot transfers: Min assist;From elevated surface General transfer comment: cuing for hand placement and safety.  Pt having a disproportionately hard time coming forward an appropriate amount.    Ambulation / Gait / Stairs / Wheelchair Mobility  Ambulation/Gait Ambulation/Gait assistance: Museum/gallery curator (Feet): 360 Feet Assistive device: Rolling walker (2 wheeled)  Gait Pattern/deviations: Step-through pattern Gait velocity: slow and guarded Gait velocity interpretation: Below normal speed for age/gender General Gait Details: short equal steps with flexed posture.     Posture / Balance Dynamic Sitting Balance Sitting balance - Comments: lists L with slightest challenge    Special needs/care consideration BiPAP/CPAP yes has home CPAP Bowel mgmt: continent but has had diarrhea since enema postop Bladder mgmt:continent    Previous Home Environment Living Arrangements: Spouse/significant other  Lives With: Spouse Available Help at Discharge: Family;Available 24 hours/day Type of Home: House Home Layout: One  level Home Access: Stairs to enter Entrance Stairs-Rails: None Entrance Stairs-Number of Steps: 2 Bathroom Shower/Tub: Multimedia programmer: Handicapped height Bathroom Accessibility: Yes How Accessible: Accessible via walker [place]: No  Discharge Living Setting Plans for Discharge Living Setting: Patient's home;Lives with (comment);Other (Comment) (spouse) Type of Home at Discharge: House Discharge Home Layout: One level Discharge Home Access: Stairs to enter Entrance Stairs-Rails: None Entrance Stairs-Number of Steps: 2 Discharge Bathroom Shower/Tub: Walk-in shower Discharge Bathroom Toilet: Handicapped height Discharge Bathroom Accessibility: Yes How Accessible: Accessible via walker Does the patient have any problems obtaining your medications?: No  Social/Family/Support Systems Patient Roles: Spouse;Parent Contact Information: Markes Shatswell, wife Anticipated Caregiver: wife and son Anticipated Caregiver's Contact Information: home [phone]; cell [phone] Ability/Limitations of Caregiver: no limitations Caregiver Availability: 24/7 Discharge Plan Discussed with Primary Caregiver: Yes Is Caregiver In Agreement with Plan?: Yes Does Caregiver/Family have Issues with Lodging/Transportation while Pt is in Rehab?: No (wife and son stay with pt in hospital 24/7)    Goals/Additional Needs Patient/Family Goal for Rehab: supervsision with PT and OT Expected length of stay: ELOS 10 to 14 days BUT pt states it will be much less Special Service Needs: having diarrhea since enema postop Pt/Family Agrees to Admission and willing to participate: Yes Program Orientation Provided & Reviewed with Pt/Caregiver Including Roles  & Responsibilities: Yes   Decrease burden of Care through IP rehab admission: n/a  Possible need for SNF placement upon discharge: n/a  Patient Condition: This patient's medical and functional status has changed since the consult dated:  01/09/14 in which the Rehabilitation Physician determined and documented that the patient's condition is appropriate for intensive rehabilitative care in an inpatient rehabilitation facility. See "History of Present Illness" (above) for medical update. Functional changes are: overall min assist. Patient's medical and functional status update has been discussed with the Rehabilitation physician and patient remains appropriate for inpatient rehabilitation. Will admit to inpatient rehab today.  Preadmission Screen Completed By:  Cleatrice Burke, 01/11/2014 12:11 PM ______________________________________________________________________   Discussed status with Dr. Letta Pate on 01/11/14 at  1211 and received telephone approval for admission today.  Admission Coordinator:  Cleatrice Burke, time 9485 Date 01/11/14.

## 2014-01-11 NOTE — Progress Notes (Signed)
Patient arrived to rehab with nurse and nurse tech. Patient's son and son-in-law at bedside. Given patient information packet, and oriented to rehab, and all questions answered.

## 2014-01-11 NOTE — Progress Notes (Signed)
Wife is concerned that patient may have early onset of dementia. Patient exhibits slight confusion intermittently at HS when in contact with staff and wife, he has asked who she was, talks about "Hoyle Sauer," frequently.  Was told during day-shift that patient is alert and oriented, less in pain, and more compliant with care regimen. Wife does not want to address possibility with physician for possible consult, she wants to address concerns over patient's pain and diarrhea. Expressed several times that nurse would like to page on-call physician to discuss and she would like to hold off on doing so at this time. Will notify on-coming nurse to speak with physician regarding issues. The wife stated that he has exhibited symptoms like this before with a prior hospitalization back in Jan 2014. Will continue to monitor.

## 2014-01-12 ENCOUNTER — Inpatient Hospital Stay (HOSPITAL_COMMUNITY): Payer: Medicare Other | Admitting: Occupational Therapy

## 2014-01-12 ENCOUNTER — Inpatient Hospital Stay (HOSPITAL_COMMUNITY): Payer: Medicare Other | Admitting: *Deleted

## 2014-01-12 ENCOUNTER — Inpatient Hospital Stay (HOSPITAL_COMMUNITY): Payer: Medicare Other

## 2014-01-12 DIAGNOSIS — IMO0002 Reserved for concepts with insufficient information to code with codable children: Secondary | ICD-10-CM

## 2014-01-12 LAB — COMPREHENSIVE METABOLIC PANEL
ALT: 123 U/L — AB (ref 0–53)
AST: 113 U/L — ABNORMAL HIGH (ref 0–37)
Albumin: 2.9 g/dL — ABNORMAL LOW (ref 3.5–5.2)
Alkaline Phosphatase: 131 U/L — ABNORMAL HIGH (ref 39–117)
BILIRUBIN TOTAL: 0.3 mg/dL (ref 0.3–1.2)
BUN: 40 mg/dL — ABNORMAL HIGH (ref 6–23)
CHLORIDE: 107 meq/L (ref 96–112)
CO2: 21 meq/L (ref 19–32)
Calcium: 10.7 mg/dL — ABNORMAL HIGH (ref 8.4–10.5)
Creatinine, Ser: 1.27 mg/dL (ref 0.50–1.35)
GFR, EST AFRICAN AMERICAN: 63 mL/min — AB (ref >90)
GFR, EST NON AFRICAN AMERICAN: 54 mL/min — AB (ref >90)
GLUCOSE: 95 mg/dL (ref 70–99)
Potassium: 4.2 mEq/L (ref 3.7–5.3)
Sodium: 143 mEq/L (ref 137–147)
Total Protein: 6.8 g/dL (ref 6.0–8.3)

## 2014-01-12 LAB — CBC WITH DIFFERENTIAL/PLATELET
Basophils Absolute: 0 10*3/uL (ref 0.0–0.1)
Basophils Relative: 0 % (ref 0–1)
Eosinophils Absolute: 0.7 10*3/uL (ref 0.0–0.7)
Eosinophils Relative: 6 % — ABNORMAL HIGH (ref 0–5)
HEMATOCRIT: 39 % (ref 39.0–52.0)
HEMOGLOBIN: 12.9 g/dL — AB (ref 13.0–17.0)
LYMPHS ABS: 1.8 10*3/uL (ref 0.7–4.0)
LYMPHS PCT: 15 % (ref 12–46)
MCH: 30.6 pg (ref 26.0–34.0)
MCHC: 33.1 g/dL (ref 30.0–36.0)
MCV: 92.6 fL (ref 78.0–100.0)
MONO ABS: 1.6 10*3/uL — AB (ref 0.1–1.0)
Monocytes Relative: 13 % — ABNORMAL HIGH (ref 3–12)
NEUTROS ABS: 7.5 10*3/uL (ref 1.7–7.7)
Neutrophils Relative %: 66 % (ref 43–77)
Platelets: 421 10*3/uL — ABNORMAL HIGH (ref 150–400)
RBC: 4.21 MIL/uL — AB (ref 4.22–5.81)
RDW: 14 % (ref 11.5–15.5)
WBC: 11.5 10*3/uL — AB (ref 4.0–10.5)

## 2014-01-12 MED ORDER — LOPERAMIDE HCL 2 MG PO CAPS
2.0000 mg | ORAL_CAPSULE | Freq: Four times a day (QID) | ORAL | Status: DC | PRN
  Administered 2014-01-15: 2 mg via ORAL
  Filled 2014-01-12 (×3): qty 1

## 2014-01-12 MED ORDER — LOPERAMIDE HCL 2 MG PO CAPS
2.0000 mg | ORAL_CAPSULE | Freq: Two times a day (BID) | ORAL | Status: AC
  Administered 2014-01-12 – 2014-01-13 (×4): 2 mg via ORAL
  Filled 2014-01-12 (×5): qty 1

## 2014-01-12 MED ORDER — HYDROCODONE-ACETAMINOPHEN 10-325 MG PO TABS
1.0000 | ORAL_TABLET | ORAL | Status: AC
  Administered 2014-01-12: 1 via ORAL
  Filled 2014-01-12: qty 1

## 2014-01-12 MED ORDER — HYDROCODONE-ACETAMINOPHEN 10-325 MG PO TABS
2.0000 | ORAL_TABLET | ORAL | Status: DC | PRN
  Administered 2014-01-12 – 2014-01-19 (×14): 2 via ORAL
  Filled 2014-01-12 (×14): qty 2

## 2014-01-12 NOTE — Progress Notes (Signed)
[facility] PHYSICAL MEDICINE & REHABILITATION     PROGRESS NOTE    Subjective/Complaints: Had a rough night with frequent loose stool. Didn't sleep much. Gout pain is better. Back is sore.  A 12 point review of systems has been performed and if not noted above is otherwise negative.   Objective: Vital Signs: Blood pressure 127/79, pulse 84, temperature 97.4 F (36.3 C), temperature source Oral, resp. rate 18, SpO2 95.00%. No results found.  Recent Labs  01/11/14 1912 01/12/14 0604  WBC 12.0* 11.5*  HGB 12.4* 12.9*  HCT 37.5* 39.0  PLT 395 421*    Recent Labs  01/11/14 1300 01/11/14 1912 01/12/14 0604  NA 142  --  143  K 4.3  --  4.2  CL 108  --  107  GLUCOSE 111*  --  95  BUN 47*  --  40*  CREATININE 1.35 1.25 1.27  CALCIUM 10.7*  --  10.7*   CBG (last 3)  No results found for this basename: GLUCAP,  in the last 72 hours  Wt Readings from Last 3 Encounters:  01/05/14 91.354 kg (201 lb 6.4 oz)  01/05/14 91.354 kg (201 lb 6.4 oz)  12/29/13 88.905 kg (196 lb)    Physical Exam:  HENT: tongue is dry Head: Normocephalic.  Eyes: EOM are normal.  Neck: Normal range of motion. Neck supple. No thyromegaly present.  Cardiovascular: Normal rate and regular rhythm.  Respiratory: Effort normal and breath sounds normal. No respiratory distress.  GI: Soft. Bowel sounds are normal. He exhibits no distension.  Neurological:  Patient is still a bit lethargic but arousable. He was able to provide his name and age as well as place. He follows simple commands. Needs extra time for orientation.   Bilateral deltoid, bicep, tricep, grip 4/5. LE , bilateral hip flexors 3 minus bilaterally knee extensors 4 minus, bilateral ankle dorsiflexors 4 minus  Sensory difficult to assess secondary to his slowed mental processing  Skin:  Back incision clean, intact wound well approximated  Psychiatric:  Flat, fatigued  Musculoskeletal: minimal pain in LE's or UE with PROM or AROM  today  Assessment/Plan: 1. Functional deficits secondary to Lumbar spondylolisthesis with radiculopathy/severe polyarticular gout. Status post laminectomy, foraminotomies discectomy 01/06/2014  which require 3+ hours per day of interdisciplinary therapy in a comprehensive inpatient rehab setting. Physiatrist is providing close team supervision and 24 hour management of active medical problems listed below. Physiatrist and rehab team continue to assess barriers to discharge/monitor patient progress toward functional and medical goals.   FIM:                   Comprehension Comprehension Mode: Auditory Comprehension: 5-Understands basic 90% of the time/requires cueing < 10% of the time  Expression Expression Mode: Verbal Expression: 4-Expresses basic 75 - 89% of the time/requires cueing 10 - 24% of the time. Needs helper to occlude trach/needs to repeat words.  Social Interaction Social Interaction: 5-Interacts appropriately 90% of the time - Needs monitoring or encouragement for participation or interaction.  Problem Solving Problem Solving: 4-Solves basic 75 - 89% of the time/requires cueing 10 - 24% of the time  Memory Memory: 5-Recognizes or recalls 90% of the time/requires cueing < 10% of the time  Medical Problem List and Plan:  1. 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 Status post laminectomy, foraminotomies discectomy 01/06/2014  2. DVT Prophylaxis/Anticoagulation: Subcutaneous heparin. Monitor platelet counts and any signs of bleeding  3. Pain Management: Robaxin and hydrocodone as needed. Monitor with  increased mobility.  4. Neuropsych: This patient is capable of making decisions on his own behalf.  5. Gout. Colchicine 0.6 mg 3 times a day (hold), continue Uloric 40 mg daily as needed gout flareup.  6. BPH. Flomax 0.4 mg daily. Check PVRs x3  7. Hypertension. Norvasc 5 mg daily. Blood pressure is under good control and monitored   8. Obstructive sleep apnea. CPAP  9. Persistent diarrhea: most likely from colchicine---hold  -scheduled imodium for now. Prn imodium  -probiotic  -encourage fluids (BUN still 40)--discussed with patient  LOS (Days) 1 A FACE TO FACE EVALUATION WAS PERFORMED  Meredith Staggers 01/12/2014 7:52 AM

## 2014-01-12 NOTE — Plan of Care (Signed)
Problem: SCI BOWEL ELIMINATION Goal: RH STG MANAGE BOWEL WITH ASSISTANCE STG Manage Bowel with minimal Assistance.  Outcome: Not Progressing Having incont, loose stools w/ foul odor.

## 2014-01-12 NOTE — Progress Notes (Signed)
Occupational Therapy Note  Patient Details  Name: Albi Rappaport MRN: [id number] Date of Birth: 1941/05/16 Today's Date: 01/12/2014  Time:1615-1715  (60 min) Pain:  8/10  Back and head ache  RN notified Individual session   Pt. Lying in bed asleep upon OT arrival.  Addressed bed mobility, sit to stand, functional mobility, pain management.  Wife present during session..  Pt. Went from supine to sit with mod assist.  Sat on EOB with SBA.  Went from sit to supine with min assist.  Ambulated to toilet with RW and minimal assist.  Had diarrhea.  Pt. Ambulated back to  Bed.  Sat on EOB and ate some dinner with much encouragement to help with pain relief.  Pt posiitioned back in bed with back brace off.  He reported his pain was feeling a bit better. Left pt in bed with call bell and wife present.        Lisa Roca 01/12/2014, 5:38 PM

## 2014-01-12 NOTE — Progress Notes (Signed)
Patient information reviewed and entered into eRehab system by Ling Flesch, RN, CRRN, PPS Coordinator.  Information including medical coding and functional independence measure will be reviewed and updated through discharge.     Per nursing patient was given "Data Collection Information Summary for Patients in Inpatient Rehabilitation Facilities with attached "Privacy Act Statement-Health Care Records" upon admission.  

## 2014-01-12 NOTE — Progress Notes (Signed)
Patient has home CPAP and does not need any assistance from RT.  Wife stated that she would put him on later tonight.  Made them aware that if they needed anything that we would be glad to help.  RT will continue to monitor.

## 2014-01-12 NOTE — Plan of Care (Signed)
Problem: RH SKIN INTEGRITY Goal: RH STG ABLE TO PERFORM INCISION/WOUND CARE W/ASSISTANCE STG Able To Perform Incision/Wound Care With moderate Assistance.  Outcome: Not Progressing Sutures to mid-back,clean and dry,No dressing,assessed Q shift by Nursing.

## 2014-01-12 NOTE — Care Management Note (Signed)
Inpatient [facility] Individual Statement of Services  Patient Name:  [name]  Date:  01/12/2014  Welcome to the [facility].  Our goal is to provide you with an individualized program based on your diagnosis and situation, designed to meet your specific needs.  With this comprehensive rehabilitation program, you will be expected to participate in at least 3 hours of rehabilitation therapies Monday-Friday, with modified therapy programming on the weekends.  Your rehabilitation program will include the following services:  Physical Therapy (PT), Occupational Therapy (OT), 24 hour per day rehabilitation nursing, Therapeutic Recreaction (TR), Psychology, Case Management (Social Worker), Rehabilitation Medicine, Nutrition Services and Pharmacy Services  Weekly team conferences will be held on Tuesdays to discuss your progress.  Your Social Worker will talk with you frequently to get your input and to update you on team discussions.  Team conferences with you and your family in attendance may also be held.  Expected length of stay: 7-10 days  Overall anticipated outcome: supervision  Depending on your progress and recovery, your program may change. Your Social Worker will coordinate services and will keep you informed of any changes. Your Social Worker's name and contact numbers are listed  below.  The following services may also be recommended but are not provided by the [facility] will be made to provide these services after discharge if needed.  Arrangements include referral to agencies that provide these services.  Your insurance has been verified to be:  [employer] Your primary doctor is:  Dr. Bebe Shaggy  Pertinent information will be shared with your doctor and your insurance company.  Social Worker:  Lennart Pall, [facility] or ([phone]   Information discussed with and copy given to patient by: Lennart Pall, 01/12/2014, 4:42 PM

## 2014-01-12 NOTE — Evaluation (Signed)
Physical Therapy Assessment and Plan  Patient Details  Name: [name] MRN: [id number] Date of Birth: [birth year]  PT Diagnosis: Abnormal posture, Difficulty walking, Impaired cognition, Low back pain and Muscle weakness Rehab Potential: Good ELOS: 7-10 days   Today's Date: 01/12/2014 Time: 9702-6378 Time Calculation (min): 48 min  Problem List:  Patient Active Problem List   Diagnosis Date Noted  . Radiculopathy 01/11/2014  . Spondylolisthesis of lumbar region 01/05/2014    Past Medical History:  Past Medical History  Diagnosis Date  . Hypertension   . Sleep apnea     cpap 7 yrs  . History of kidney stones   . Arthritis   . Gout   . Cancer 01    colon   Past Surgical History:  Past Surgical History  Procedure Laterality Date  . Colon surgery  01    ca  . Tonsillectomy    . Appendectomy      Assessment & Plan Clinical Impression: Patient is a 73 y.o. year old male with recent admission to the hospital with history of hypertension as well as sleep apnea and gout. Patient was independent prior to admission living with his wife. Admitted 01/04/2014 with progressive low back pain that has progressed over the last 2 years. No relief with conservative care including epidural injections. 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 Underwent bilateral L3 laminectomy, foraminotomy bilateral L4-5 discectomy posterior lateral arthrodesis 01/06/2014 per Dr. Joya Salm. Postoperative pain management. Back brace when out of bed applied in sitting position. Subcutaneous heparin for DVT prophylaxis. Physical and occupational therapy evaluations completed 01/08/2014 with recommendations for physical medicine rehabilitation consult. Patient was admitted for comprehensive rehabilitation program.  Patient transferred to CIR on 01/11/2014 .   Patient currently requires min with mobility secondary to muscle  weakness and muscle joint tightness, decreased cardiorespiratoy endurance, decreased memory and decreased sitting balance, decreased standing balance, decreased balance strategies and difficulty maintaining precautions.  Prior to hospitalization, patient was independent  with mobility and lived with Spouse in a House home.  Home access is 2Stairs to enter.  Patient will benefit from skilled PT intervention to maximize safe functional mobility, minimize fall risk and decrease caregiver burden for planned discharge home with 24 hour supervision.  Anticipate patient will benefit from follow up [facility] at discharge.  PT - End of Session Activity Tolerance: Decreased this session Endurance Deficit: Yes Endurance Deficit Description: pain and fatigue a limiting factor during eval. pt also with complaints of nausea. PT Assessment Rehab Potential: Good PT Patient demonstrates impairments in the following area(s): Balance;Endurance;Pain;Safety;Skin Integrity PT Transfers Functional Problem(s): Bed Mobility;Bed to Chair;Car;Furniture PT Locomotion Functional Problem(s): Ambulation;Stairs PT Plan PT Intensity: Minimum of 1-2 x/day ,45 to 90 minutes PT Frequency: 5 out of 7 days PT Duration Estimated Length of Stay: 7-10 days PT Treatment/Interventions: Ambulation/gait training;Balance/vestibular training;Cognitive remediation/compensation;Community reintegration;Discharge planning;Disease management/prevention;DME/adaptive equipment instruction;Functional mobility training;Neuromuscular re-education;Pain management;Patient/family education;Psychosocial support;Skin care/wound management;Splinting/orthotics;Stair training;Therapeutic Activities;Therapeutic Exercise;UE/LE Strength taining/ROM;UE/LE Coordination activities;Wheelchair propulsion/positioning PT Transfers Anticipated Outcome(s): S overall PT Locomotion Anticipated Outcome(s): S overall PT Recommendation Recommendations for Other Services: Neuropsych  consult Follow Up Recommendations: Home health PT;24 hour supervision/assistance Patient destination: Home Equipment Recommended: None recommended by PT Equipment Details: Pt's wife reports they have multiple pieces of equipment at home from family members. Recommending family to bring in RW to determine if appropriate for pt's use.  Skilled Therapeutic Intervention Individual treatment initiated with focus on functional mobility re-training and education  on importance of mobility despite pain and fatigue. Pt appears somewhat slow to process this AM and emotional during session, but participated in session to tolerance. Ended session early due to pain and fatigue and returned back to bed end of session to rest.   PT Evaluation Precautions/Restrictions Precautions Precautions: Back;Fall Precaution Comments: Therapist reviewed back precautions. Required Braces or Orthoses: Spinal Brace Spinal Brace: Lumbar corset;Applied in sitting position Restrictions Weight Bearing Restrictions: No General Amount of Missed PT Time (min): 12 Minutes Missed Time Reason: Pain  Pain  c/o pain in low back throughout session limiting pt participation. Pt also with c/o nausea. Returned to bed end of session. Home Living/Prior Functioning Home Living Available Help at Discharge: Family;Available 24 hours/day Type of Home: House Home Access: Stairs to enter [employer] of Steps: 2 Entrance Stairs-Rails: None Home Layout: One level  Lives With: Spouse Prior Function Level of Independence: Independent with gait;Independent with transfers  Able to Take Stairs?: Yes Comments: Retired from [employer], clinical (histocompatibility and immunogenetics). Spends a lot of time with his grandchildren.  Cognition Overall Cognitive Status: Within Functional Limits for tasks assessed Arousal/Alertness: Awake/alert Orientation Level: Oriented X4 Comments: Pt appears to be very emotional (tearful during session when speaking of his grandkids), some  difficulty with recall of hospital course (wife would fill in and correct), and pt very limited by pain. Will continue to assess and monitor. Follows all commands and is appropriate throughout session though. Sensation Sensation Light Touch: Appears Intact Additional Comments: BUE appear intact Coordination Gross Motor Movements are Fluid and Coordinated: Yes Fine Motor Movements are Fluid and Coordinated: Yes Motor  Motor Motor: Within Functional Limits Motor - Skilled Clinical Observations: generalized deconditioning  Locomotion  Ambulation Ambulation/Gait Assistance: 4: Min assist  Trunk/Postural Assessment  Cervical Assessment Cervical Assessment: Within Functional Limits Thoracic Assessment Thoracic Assessment: Exceptions to [facility] (kyphotic posture; back precautions; Lumbar corset) Lumbar Assessment Lumbar Assessment: Exceptions to [facility] (back precautions; Lumbar corset) Postural Control Postural Control:  (decreased balance reactions)  Balance Balance Balance Assessed: Yes Static Sitting Balance Static Sitting - Level of Assistance: 5: Stand by assistance Dynamic Sitting Balance Dynamic Sitting - Level of Assistance: 5: Stand by assistance Static Standing Balance Static Standing - Level of Assistance: 4: Min assist (with RW) Dynamic Standing Balance Dynamic Standing - Level of Assistance: 4: Min assist (with RW) Extremity Assessment  RUE Assessment RUE Assessment: Within Functional Limits (can benefit from BUE strengthening ) LUE Assessment LUE Assessment: Within Functional Limits (can benefit from BUE strengthening ) RLE Assessment RLE Assessment:  (grossly 3+/5) LLE Assessment LLE Assessment:  (grossly 3+/5)  FIM:  FIM - Bed/Chair Transfer Bed/Chair Transfer Assistive Devices: Arm rests;Walker;Orthosis Bed/Chair Transfer: 3: Sit > Supine: Mod A (lifting assist/Pt. 50-74%/lift 2 legs);4: Chair or W/C > Bed: Min A (steadying Pt. > 75%) FIM - Locomotion:  Wheelchair Locomotion: Wheelchair: 1: Total Assistance/staff pushes wheelchair (Pt<25%) FIM - Locomotion: Ambulation Locomotion: Ambulation Assistive Devices: Walker - Rolling;Orthosis Ambulation/Gait Assistance: 4: Min assist Locomotion: Ambulation: 2: Travels 50 - 149 ft with minimal assistance (Pt.>75%) FIM - Locomotion: Stairs Locomotion: Scientist, physiological: Hand rail - 2 Locomotion: Stairs: 1: Up and Down < 4 stairs with minimal assistance (Pt.>75%)   Refer to Care Plan for Long Term Goals  Recommendations for other services: Neuropsych  Discharge Criteria: Patient will be discharged from PT if patient refuses treatment 3 consecutive times without medical reason, if treatment goals not met, if there is a change in medical status, if patient makes no progress towards  goals or if patient is discharged from hospital.  The above assessment, treatment plan, treatment alternatives and goals were discussed and mutually agreed upon: by patient and by family  Lars Masson 01/12/2014, 11:51 AM

## 2014-01-12 NOTE — Evaluation (Signed)
Occupational Therapy Assessment and Plan & Session Notes  Patient Details  Name: [name] MRN: [id number] Date of Birth: 1941-03-19  OT Diagnosis: acute pain, altered mental status and muscle weakness (generalized) Rehab Potential: Rehab Potential: Good ELOS: 7-10 days   Today's Date: 01/12/2014  Problem List:  Patient Active Problem List   Diagnosis Date Noted  . Radiculopathy 01/11/2014  . Spondylolisthesis of lumbar region 01/05/2014    Past Medical History:  Past Medical History  Diagnosis Date  . Hypertension   . Sleep apnea     cpap 7 yrs  . History of kidney stones   . Arthritis   . Gout   . Cancer 01    colon   Past Surgical History:  Past Surgical History  Procedure Laterality Date  . Colon surgery  01    ca  . Tonsillectomy    . Appendectomy      Assessment & Plan Clinical Impression: [name] is a 74 y.o. right-handed male with history of hypertension as well as sleep apnea and gout. Patient was independent prior to admission living with his wife. Admitted 01/04/2014 with progressive low back pain that has progressed over the last 2 years. No relief with conservative care including epidural injections. 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 Underwent bilateral L3 laminectomy, foraminotomy bilateral L4-5 discectomy posterior lateral arthrodesis 01/06/2014 per Dr. Joya Salm. Postoperative pain management. Back brace when out of bed applied in sitting position. Subcutaneous heparin for DVT prophylaxis. Physical and occupational therapy evaluations completed 01/08/2014 with recommendations for physical medicine rehabilitation consult. Patient was admitted for comprehensive rehabilitation program. Patient's measured complaint is poor appetite. He is trying to avoid purine rich foods because of his gout. Patient transferred to CIR on 01/11/2014 .    Patient currently  requires min-mod assist with basic self-care skills and IADL secondary to muscle weakness and muscle joint tightness, decreased awareness and decreased memory and decreased sitting balance, decreased standing balance, decreased postural control, decreased balance strategies and difficulty maintaining precautions.  Prior to hospitalization, patient could complete ADLs and IADLs independently.   Patient will benefit from skilled intervention to increase independence with basic self-care skills prior to discharge home with care partner.  Anticipate patient will require 24 hour supervision and follow up home health.  OT - End of Session Activity Tolerance: Tolerates 10 - 20 min activity with multiple rests Endurance Deficit: Yes OT Assessment Rehab Potential: Good Barriers to Discharge:  (none known) OT Patient demonstrates impairments in the following area(s): Balance;Edema;Endurance;Pain;Perception;Safety;Sensory;Skin Integrity OT Basic ADL's Functional Problem(s): Grooming;Bathing;Dressing;Toileting OT Advanced ADL's Functional Problem(s): Simple Meal Preparation OT Transfers Functional Problem(s): Toilet;Tub/Shower OT Additional Impairment(s): None (strengthening > BUEs) OT Plan OT Intensity: Minimum of 1-2 x/day, 45 to 90 minutes OT Frequency: 5 out of 7 days OT Duration/Estimated Length of Stay: 7-10 days OT Treatment/Interventions: Balance/vestibular training;Community reintegration;Discharge planning;DME/adaptive equipment instruction;Functional mobility training;Pain management;Patient/family education;Psychosocial support;Self Care/advanced ADL retraining;Skin care/wound managment;Splinting/orthotics;Therapeutic Activities;Therapeutic Exercise;UE/LE Strength taining/ROM;UE/LE Coordination activities;Wheelchair propulsion/positioning OT Self Feeding Anticipated Outcome(s): Independent OT Basic Self-Care Anticipated Outcome(s): supervision OT Toileting Anticipated Outcome(s):  supervision OT Bathroom Transfers Anticipated Outcome(s): supervision OT Recommendation Patient destination: Home Follow Up Recommendations: Home health OT Equipment Recommended: 3 in 1 bedside comode;Tub/shower seat  Precautions/Restrictions  Precautions Precautions: Back;Fall Precaution Comments: Patient able to verbalize 2/3 back precautions, therapist reviewed no BAT Required Braces or Orthoses: Spinal Brace Spinal Brace: Lumbar corset;Applied in sitting position Restrictions Weight Bearing Restrictions: No  General Chart Reviewed: Yes Family/Caregiver Present: Yes (Wife, Hoyle Sauer)  Home Living/Prior Functioning Home Living Available Help at Discharge: Family;Available 24 hours/day Type of Home: House Home Access: Stairs to enter [employer] of Steps: 2 Entrance Stairs-Rails: None Home Layout: One level  Lives With: Spouse IADL History Homemaking Responsibilities: Yes Meal Prep Responsibility: Primary Laundry Responsibility: Primary Cleaning Responsibility: Primary Bill Paying/Finance Responsibility: Secondary Shopping Responsibility: Secondary Child Care Responsibility: No Current License: Yes Occupation: Retired Type of Occupation: Retired from Engineer, building services: farm, traveling/camping, 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 Patient tearful during session when talking about family and PTA aspects.    Sensation Sensation Additional Comments: BUE appear intact Coordination Gross Motor Movements are Fluid and Coordinated: Yes Fine Motor Movements are Fluid and Coordinated: Yes  Motor  Motor Motor: Within Functional Limits Motor - Skilled Clinical Observations: generalized deconditioning  Trunk/Postural Assessment  Cervical Assessment Cervical Assessment: Within Functional Limits Thoracic Assessment Thoracic Assessment: Exceptions to [facility] (kyphotic posture, back precautions, lumbar corset) Lumbar Assessment Lumbar Assessment: Exceptions to [facility] (back precautions, lumbar corset) Postural Control Postural Control:  (decreased balance reactions)   Balance Balance Balance Assessed: Yes Static Sitting Balance Static Sitting - Level of Assistance: 5: Stand by assistance Dynamic Sitting Balance Dynamic Sitting - Level of Assistance: 5: Stand by assistance Sitting balance - Comments: still lists left, but can correct today Static Standing Balance Static Standing - Level of Assistance: 4: Min assist (with RW) Dynamic Standing Balance Dynamic Standing - Level of Assistance: 4: Min assist (with RW)  Extremity/Trunk Assessment RUE Assessment RUE Assessment: Within Functional Limits (can benefit from BUE strengthening ) LUE Assessment LUE Assessment: Within Functional Limits (can benefit from BUE strengthening )  FIM:  FIM - Eating Eating Activity: 0: Activity did not occur FIM - Grooming Grooming Steps: Wash, rinse, dry face;Wash, rinse, dry hands;Oral care, brush teeth, clean dentures;Brush, comb hair Grooming: 5: Set-up assist to obtain items FIM - Bathing Bathing Steps Patient Completed: Chest;Right Arm;Left Arm;Abdomen;Front perineal area;Right upper leg;Left upper leg Bathing: 3: Mod-Patient completes 5-7 77f 10 parts or 50-74% FIM - Upper Body Dressing/Undressing Upper body dressing/undressing steps patient completed: Thread/unthread right sleeve of pullover  shirt/dresss;Put head through opening of pull over shirt/dress;Pull shirt over trunk;Thread/unthread left sleeve of pullover shirt/dress Upper body dressing/undressing: 5: Set-up assist to: Obtain clothing/put away FIM - Lower Body Dressing/Undressing Lower body dressing/undressing: 1: Total-Patient completed less than 25% of tasks FIM - Toileting Toileting: 0: Activity did not occur FIM - Air cabin crew Transfers: 0-Activity did not occur FIM - Systems developer Devices: Tub transfer bench;Grab bars Tub/shower Transfers: 4-Into Tub/Shower: Min A (steadying Pt. > 75%/lift 1 leg);4-Out of Tub/Shower: Min A (steadying Pt. > 75%/lift 1 leg)   Refer to Care Plan for Long Term Goals  Recommendations for other services: Neuropsych  Discharge Criteria: Patient will be discharged from OT if patient refuses treatment 3 consecutive times without medical reason, if treatment goals not met, if there is a change in medical status, if patient makes no progress towards goals or if patient is discharged  from hospital.  The above assessment, treatment plan, treatment alternatives and goals were discussed and mutually agreed upon: by patient and by family  ----------------------------------------------------------------------------------------------------------------------------  SESSION NOTES  Session #1 [phone] - 55 Minutes Individual Therapy Patient with complaints of discomfort during therapy, RN aware Initial 1:1 occupational therapy evaluation completed. Focused skilled intervention on bed mobility, education on back precautions, donning/doffing of lumbar corset, sit<>stands, stand pivot transfers, ADL retraining at shower level, UB/LB bathing in seated position secondary to lumbar corset wearing orders, UB/LB dressing in sit<>stand position, grooming tasks seated at sink in w/c, and overall activity tolerance/endurance. Orders stated for patient to "shower with  wound open". At end of session, left patient seated in w/c beside bed with wife present.   Session #2 [id number] - 30 Minutes Individual Therapy No complaints of pain Patient received supine in bed. Patient engaged in bed mobility, sat EOB with supervision and donned lumbar corset with minimal assistance. From here, patient stood with RW and ambulated into bathroom for toilet transfer and toileting; patient required min assist for transfer and toileting. From here, patient ambulated > sink for grooming tasks of washing hands. Then patient ambulated > recliner. Therapist assisted with positioning patient in recliner and left patient seated in recliner with all needs within reach. Patient takes much more than a reasonable amount of time to perform tasks.  [name] 01/12/2014, 3:30 PM

## 2014-01-13 ENCOUNTER — Inpatient Hospital Stay (HOSPITAL_COMMUNITY): Payer: Medicare Other

## 2014-01-13 ENCOUNTER — Inpatient Hospital Stay (HOSPITAL_COMMUNITY): Payer: Medicare Other | Admitting: Occupational Therapy

## 2014-01-13 DIAGNOSIS — M4310 Spondylolisthesis, site unspecified: Secondary | ICD-10-CM

## 2014-01-13 LAB — BASIC METABOLIC PANEL
BUN: 36 mg/dL — ABNORMAL HIGH (ref 6–23)
CO2: 22 mEq/L (ref 19–32)
CREATININE: 1.21 mg/dL (ref 0.50–1.35)
Calcium: 10.9 mg/dL — ABNORMAL HIGH (ref 8.4–10.5)
Chloride: 109 mEq/L (ref 96–112)
GFR, EST AFRICAN AMERICAN: 67 mL/min — AB (ref >90)
GFR, EST NON AFRICAN AMERICAN: 58 mL/min — AB (ref >90)
GLUCOSE: 101 mg/dL — AB (ref 70–99)
POTASSIUM: 4.1 meq/L (ref 3.7–5.3)
Sodium: 145 mEq/L (ref 137–147)

## 2014-01-13 MED ORDER — FENTANYL 25 MCG/HR TD PT72
25.0000 ug | MEDICATED_PATCH | TRANSDERMAL | Status: DC
  Administered 2014-01-13 – 2014-01-19 (×3): 25 ug via TRANSDERMAL
  Filled 2014-01-13 (×3): qty 1

## 2014-01-13 NOTE — Progress Notes (Signed)
Physical Therapy Session Note  Patient Details  Name: [name] MRN: [id number] Date of Birth: [birth year]  Today's Date: 01/13/2014  Short Term Goals: Week 1:  PT Short Term Goal 1 (Week 1): = LTGs  Session #1: Time: 242-353 (56 min) Reports pain 4/10 in low back but much better than yesterday - declines need for medication. Initially pt completing functional bed mobility and standing balance with RW to pull up pants and pt donned LSO with cues to prepare for session. Gait to and from  therapy gym with RW with close S/steady A for general endurance and strengthening. Stair negotiation with bilateral rails (pt reports they plan to build rails at home for the steps to enter) with min A and demonstrated technique with RW putting up on the second step and then using the rails to step up to simulate home environment. Repeated multiple times for total of 10 steps with steady A. Dynamic standing balance activity with 1 UE support to simulate functional reaching task and maintaining back precautions with overall S using RW for support. Positioned up in recliner at end of session to promote OOB tolerance and increased endurance.   Session #2: Time: 1255-1355 (60 min) No complaints of pain at this time. Donned LSO EOB with cues for appropriate placement. Gait to/from therapy gym with S with RW for endurance and strengthening. Dynamic gait through obstacle course navigating turns, stepping over simulated threshold, navigating curb step, and sidestepping to simulate home environment gait and mobility with close S/intermittent steady A. Nustep for endurance and strengthening x 10 min on level 4 with multiple rest breaks. Pt returned to bed end of session to rest his back (completed with S).  Therapy Documentation Precautions:  Precautions Precautions: Back;Fall Precaution Comments: Pt able to recall 3/3 back precautions Required Braces or Orthoses: Spinal Brace Spinal Brace: Lumbar corset;Applied in  sitting position Restrictions Weight Bearing Restrictions: No   See FIM for current functional status  Therapy/Group: Individual Therapy  Lars Masson 01/13/2014, 2:16 PM

## 2014-01-13 NOTE — Progress Notes (Addendum)
[name] is a 72 y.o. male Sep 13, 1941 [id number]  Subjective: No new complaints. No new problems. C/o LBP 7-8/10 since surgery off and on - wants to go back to bed... Objective: Vital signs in last 24 hours: Temp:  [97.5 F (36.4 C)-98.1 F (36.7 C)] 98.1 F (36.7 C) (04/25 0628) Pulse Rate:  [92-94] 92 (04/25 0628) Resp:  [19-20] 19 (04/25 0628) BP: (134-145)/(71-81) 145/71 mmHg (04/25 0628) SpO2:  [95 %-97 %] 95 % (04/25 0628) Weight change:  Last BM Date: 01/12/14  Intake/Output from previous day: 04/24 0701 - 04/25 0700 In: -  Out: 975 [Urine:975] Last cbgs: CBG (last 3)  No results found for this basename: GLUCAP,  in the last 72 hours   Physical Exam General: In apparent distress - moaning w/pain   HEENT: not dry Lungs: Normal effort. Lungs clear to auscultation, no crackles or wheezes. Cardiovascular: Regular rate and rhythm, no edema Abdomen: S/NT/ND; BS(+) Musculoskeletal:  Unchanged LS in a brace, painful w/ROM Neurological: No new neurological deficits Wounds:N/E  Skin: clear  Aging changes Mental state: Alert, oriented, cooperative    Lab Results: BMET    Component Value Date/Time   NA 145 01/13/2014 0625   K 4.1 01/13/2014 0625   CL 109 01/13/2014 0625   CO2 22 01/13/2014 0625   GLUCOSE 101* 01/13/2014 0625   BUN 36* 01/13/2014 0625   CREATININE 1.21 01/13/2014 0625   CALCIUM 10.9* 01/13/2014 0625   GFRNONAA 58* 01/13/2014 0625   GFRAA 67* 01/13/2014 0625   CBC    Component Value Date/Time   WBC 11.5* 01/12/2014 0604   RBC 4.21* 01/12/2014 0604   HGB 12.9* 01/12/2014 0604   HCT 39.0 01/12/2014 0604   PLT 421* 01/12/2014 0604   MCV 92.6 01/12/2014 0604   MCH 30.6 01/12/2014 0604   MCHC 33.1 01/12/2014 0604   RDW 14.0 01/12/2014 0604   LYMPHSABS 1.8 01/12/2014 0604   MONOABS 1.6* 01/12/2014 0604   EOSABS 0.7 01/12/2014 0604   BASOSABS 0.0 01/12/2014 0604    Studies/Results: No results found.  Medications: I have reviewed the patient's current  medications.  Assessment/Plan:  1. 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 Status post laminectomy, foraminotomies discectomy 01/06/2014. Added Duragesic for LBP. 2. DVT Prophylaxis/Anticoagulation: Subcutaneous heparin. Monitor platelet counts and any signs of bleeding  3. Pain Management: Robaxin and hydrocodone as needed. Discussed w/pt.  Monitor with increased mobility.  4. Neuropsych: This patient is capable of making decisions on his own behalf.  5. Gout. Colchicine 0.6 mg 3 times a day (hold), continue Uloric 40 mg daily as needed gout flareup.  6. BPH. Flomax 0.4 mg daily. Check PVRs x3  7. Hypertension. Norvasc 5 mg daily. Blood pressure is under good control and monitored  8. Obstructive sleep apnea. CPAP  9. Persistent diarrhea: most likely from colchicine---hold  -scheduled imodium for now. Prn imodium  -probiotic  -encourage fluids (BUN still 40)--discussed with patient        Length of stay, days: 2  Cassandria Anger , MD 01/13/2014, 9:36 AM

## 2014-01-13 NOTE — Progress Notes (Signed)
Occupational Therapy Session Note  Patient Details  Name: [name] MRN: [id number] Date of Birth: [birth year]  Today's Date: 01/13/2014 Time: 1030-1120 Time Calculation (min): 50 min  Skilled Therapeutic Interventions/Progress Updates: Patient seen for skilled OT therapy to address self care and functional mobility given his c/os of great pain and back precautions.  Patient with challenges reaching feet and lower legs for bathing and dressing.  He stated, "I can't do that yet."  He appeared to lack hip flexion for bringing feet up over opposite knee.  Patient stated that his wife may purchase a reacher, long handled shoe horn, sock aide, etc.  Patient would benefit from practice with the adaptive equipment as today he was fairly dependent for Lower body bathing and dressing.      Therapy Documentation Precautions:  Precautions Precautions: Back;Fall Precaution Comments: Pt able to recall 3/3 back precautions Required Braces or Orthoses: Spinal Brace Spinal Brace: Lumbar corset;Applied in sitting position Restrictions Weight Bearing Restrictions: No   Pain: denied. Was given pain meds prior to session and a pain patch during session  See FIM for current functional status  Therapy/Group: Individual Therapy  Herschell Dimes 01/13/2014, 12:45 PM

## 2014-01-13 NOTE — Progress Notes (Signed)
Occupational Therapy Session Note  Patient Details  Name: [name] MRN: [id number] Date of Birth: May 20, 1941  Today's Date: 01/13/2014 Time: 1430-1500 Time Calculation (min): 30 min  Skilled Therapeutic Interventions/Progress Updates: Patient seen this pm for Adaptive Equipment training.  He was able to use reacher and sockaide to don and doff pants and socks with distant S.   Patient reluctant to try bringing legs up cross over each other for donning pants and stated, "I am not going to even try that because I am not going to take a chance on undoing this surgery or hurting myself."   Patient was able to demonstrate application of BAT back precautions during sessions today (No bending, arching or twisting of back).   Patient with positive affects and conversation today.    Patient left supine in bed with call bell, urinal and call bell in place.     Therapy Documentation Precautions:  Precautions Precautions: Back;Fall Precaution Comments: Pt able to recall 3/3 back precautions Required Braces or Orthoses: Spinal Brace Spinal Brace: Lumbar corset;Applied in sitting position Restrictions Weight Bearing Restrictions: No  Pain: "I always have back pain.  It is inherited from my father and grandfather." He elected not to rate the low back pain.   See FIM for current functional status  Therapy/Group: Individual Therapy  Herschell Dimes 01/13/2014, 4:01 PM

## 2014-01-14 ENCOUNTER — Inpatient Hospital Stay (HOSPITAL_COMMUNITY): Payer: Medicare Other | Admitting: *Deleted

## 2014-01-14 DIAGNOSIS — M545 Low back pain, unspecified: Secondary | ICD-10-CM

## 2014-01-14 DIAGNOSIS — I10 Essential (primary) hypertension: Secondary | ICD-10-CM

## 2014-01-14 NOTE — Progress Notes (Signed)
Patient has a home CPAP unit and uses the machine as he likes. Patient is aware to call if he does need any help at all with the machine. RT will continue to assist as needed.
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
## 2014-01-14 NOTE — IPOC Note (Signed)
Overall Plan of Care [facility]) Patient Details Name: [name] MRN: [id number] DOB: 09-28-1940  Admitting Diagnosis: [facility] Problems: Active Problems:   Radiculopathy     Functional Problem List: Nursing Bowel;Medication Management;Nutrition;Pain;Safety;Skin Integrity  PT Balance;Endurance;Pain;Safety;Skin Integrity  OT Balance;Edema;Endurance;Pain;Perception;Safety;Sensory;Skin Integrity  SLP    TR         Basic ADL's: OT Grooming;Bathing;Dressing;Toileting     Advanced  ADL's: OT Simple Meal Preparation     Transfers: PT Bed Mobility;Bed to Chair;Car;Furniture  OT Toilet;Tub/Shower     Locomotion: PT Ambulation;Stairs     Additional Impairments: OT None (strengthening > BUEs)  SLP        TR      Anticipated Outcomes Item Anticipated Outcome  Self Feeding Independent  Swallowing      Basic self-care  supervision  Toileting  supervision   Bathroom Transfers supervision  Bowel/Bladder  LBM 4/23, continent of bladder using urinal  Transfers  S overall  Locomotion  S overall  Communication     Cognition     Pain  4 or less  Safety/Judgment  Supervision   Therapy Plan: PT Intensity: Minimum of 1-2 x/day ,45 to 90 minutes PT Frequency: 5 out of 7 days PT Duration Estimated Length of Stay: 7-10 days OT Intensity: Minimum of 1-2 x/day, 45 to 90 minutes OT Frequency: 5 out of 7 days OT Duration/Estimated Length of Stay: 7-10 days         Team Interventions: Nursing Interventions Patient/Family Education;Bowel Management;Disease Management/Prevention;Pain Management;Medication Management;Skin Care/Wound Management  PT interventions Ambulation/gait training;Balance/vestibular training;Cognitive remediation/compensation;Community reintegration;Discharge planning;Disease management/prevention;DME/adaptive equipment instruction;Functional mobility training;Neuromuscular re-education;Pain management;Patient/family education;Psychosocial  support;Skin care/wound management;Splinting/orthotics;Stair training;Therapeutic Activities;Therapeutic Exercise;UE/LE Strength taining/ROM;UE/LE Coordination activities;Wheelchair propulsion/positioning  OT Interventions Balance/vestibular training;Community reintegration;Discharge planning;DME/adaptive equipment instruction;Functional mobility training;Pain management;Patient/family education;Psychosocial support;Self Care/advanced ADL retraining;Skin care/wound managment;Splinting/orthotics;Therapeutic Activities;Therapeutic Exercise;UE/LE Strength taining/ROM;UE/LE Coordination activities;Wheelchair propulsion/positioning  SLP Interventions    TR Interventions    SW/CM Interventions Discharge Planning;Psychosocial Support;Patient/Family Education    Team Discharge Planning: Destination: PT-Home ,OT- Home , SLP-  Projected Follow-up: PT-Home health PT;24 hour supervision/assistance, OT-  Home health OT, SLP-  Projected Equipment Needs: PT-None recommended by PT, OT- 3 in 1 bedside comode;Tub/shower seat, SLP-  Equipment Details: PT-Pt's wife reports they have multiple pieces of equipment at home from family members. Recommending family to bring in RW to determine if appropriate for pt's use., OT-  Patient/family involved in discharge planning: PT- Patient;Family member/caregiver,  OT-Patient;Family member/caregiver, SLP-   MD ELOS: 7-10 days Medical Rehab Prognosis:  Good Assessment: The patient has been admitted for CIR therapies. The team will be addressing functional mobility, strength, stamina, balance, safety, adaptive techniques and equipment, self-care, bowel and bladder mgt, patient and caregiver education, pain mgt, nutrition, back precautions, ego-support. Goals have been set at supervision for self-care and mobility.  Diarrhea and pain have been early functional obstacles.    Meredith Staggers, MD, FAAPMR      See Team Conference Notes for weekly updates to the plan of care

## 2014-01-14 NOTE — Progress Notes (Signed)
Physical Therapy Note  Patient Details  Name: [name] MRN: [id number] Date of Birth: [birth year] Today's Date: 01/14/2014  Patient in bed, sleeping, wife present in the room, asks not to wake patient up as he is feeling sick and nauseated ,and has just received medicine. Reported to nursing.  [name] 01/14/2014, 3:17 PM

## 2014-01-14 NOTE — Progress Notes (Signed)
[name] is a 73 y.o. male 1941/03/02 [id number]  Subjective: No new complaints. No new problems.  LBP is better 3-4/10 now. Objective: Vital signs in last 24 hours: Temp:  [98 F (36.7 C)-98.1 F (36.7 C)] 98 F (36.7 C) (04/26 0553) Pulse Rate:  [67-88] 84 (04/26 0553) Resp:  [16-19] 17 (04/26 0553) BP: (118-124)/(72-77) 121/76 mmHg (04/26 0553) SpO2:  [94 %-98 %] 94 % (04/26 0553) Weight change:  Last BM Date: 01/12/14  Intake/Output from previous day: 04/25 0701 - 04/26 0700 In: 600 [P.O.:600] Out: -  Last cbgs: CBG (last 3)  No results found for this basename: GLUCAP,  in the last 72 hours   Physical Exam General: NAD HEENT: not dry Lungs: Normal effort. Lungs clear to auscultation, no crackles or wheezes. Cardiovascular: Regular rate and rhythm, no edema Abdomen: S/NT/ND; BS(+) Musculoskeletal:  Unchanged LS in a brace, painful w/ROM Neurological: No new neurological deficits Wounds:N/E  Skin: clear  Aging changes Mental state: Alert, oriented, cooperative    Lab Results: BMET    Component Value Date/Time   NA 145 01/13/2014 0625   K 4.1 01/13/2014 0625   CL 109 01/13/2014 0625   CO2 22 01/13/2014 0625   GLUCOSE 101* 01/13/2014 0625   BUN 36* 01/13/2014 0625   CREATININE 1.21 01/13/2014 0625   CALCIUM 10.9* 01/13/2014 0625   GFRNONAA 58* 01/13/2014 0625   GFRAA 67* 01/13/2014 0625   CBC    Component Value Date/Time   WBC 11.5* 01/12/2014 0604   RBC 4.21* 01/12/2014 0604   HGB 12.9* 01/12/2014 0604   HCT 39.0 01/12/2014 0604   PLT 421* 01/12/2014 0604   MCV 92.6 01/12/2014 0604   MCH 30.6 01/12/2014 0604   MCHC 33.1 01/12/2014 0604   RDW 14.0 01/12/2014 0604   LYMPHSABS 1.8 01/12/2014 0604   MONOABS 1.6* 01/12/2014 0604   EOSABS 0.7 01/12/2014 0604   BASOSABS 0.0 01/12/2014 0604    Studies/Results: No results found.  Medications: I have reviewed the patient's current medications.  Assessment/Plan:  1. 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 Status post laminectomy, foraminotomies discectomy 01/06/2014. Duragesic helped w/LBP. 2. DVT Prophylaxis/Anticoagulation: Subcutaneous heparin. Monitor platelet counts and any signs of bleeding  3. Pain Management: Robaxin and hydrocodone as needed. Discussed w/pt.  Monitor with increased mobility.  4. Neuropsych: This patient is capable of making decisions on his own behalf.  5. Gout. Colchicine 0.6 mg 3 times a day (hold), continue Uloric 40 mg daily as needed gout flareup.  6. BPH. Flomax 0.4 mg daily. Check PVRs x3  7. Hypertension. Norvasc 5 mg daily. Blood pressure is under good control and monitored  8. Obstructive sleep apnea. CPAP  9. Persistent diarrhea: most likely from colchicine---hold  -scheduled imodium for now. Prn imodium  -probiotic  -encourage fluids (BUN still 40)--discussed with patient   Cont Rx       Length of stay, days: [place] , MD 01/14/2014, 8:40 AM

## 2014-01-15 ENCOUNTER — Inpatient Hospital Stay (HOSPITAL_COMMUNITY): Payer: Medicare Other | Admitting: Occupational Therapy

## 2014-01-15 ENCOUNTER — Inpatient Hospital Stay (HOSPITAL_COMMUNITY): Payer: Medicare Other

## 2014-01-15 ENCOUNTER — Encounter (HOSPITAL_COMMUNITY): Payer: Medicare Other

## 2014-01-15 ENCOUNTER — Encounter (HOSPITAL_COMMUNITY): Payer: Medicare Other | Admitting: Occupational Therapy

## 2014-01-15 DIAGNOSIS — IMO0002 Reserved for concepts with insufficient information to code with codable children: Secondary | ICD-10-CM

## 2014-01-15 NOTE — Progress Notes (Signed)
Physical Therapy Session Note  Patient Details  Name: [name] MRN: [id number] Date of Birth: 06-10-1941  Today's Date: 01/15/2014 Time: 0900-0955 Time Calculation (min): 55 min  Short Term Goals: Week 1:  PT Short Term Goal 1 (Week 1): = LTGs  Skilled Therapeutic Interventions/Progress Updates:   Session focused on gait training with RW for endurance and general strengthening, stair negotiation with bilateral rails and with 1 rail to challenge balance with close S/steady A, simulated car transfer to prepare for d/c home (S with RW and cues for safe technique), bed mobility on flat bed in ADL apartment to simulate home environment (mod I and able to recall 3/3 back precautions independently), furniture transfers with S, and handout given for supine LE therex for pt to work on in evenings when not in therapy (pt requested this as he states he does not want to feel like he is just "wasting time laying around"). Positioned in supine in bed to rest before next therapy session.  Will discuss with primary OT, but likely to upgrade goals to overall mod I for transfers and gait with RW as pt demonstrating safe behaviors and independent with his back precautions.   Therapy Documentation Precautions:  Precautions Precautions: Back;Fall Precaution Comments: Pt able to recall 3/3 back precautions Required Braces or Orthoses: Spinal Brace Spinal Brace: Lumbar corset;Applied in sitting position Restrictions Weight Bearing Restrictions: No  Pain: Denies pain currently.  See FIM for current functional status  Therapy/Group: Individual Therapy  Lars Masson 01/15/2014, 9:57 AM

## 2014-01-15 NOTE — Progress Notes (Signed)
Physical Therapy Session Note  Patient Details  Name: Myan Suit MRN: [id number] Date of Birth: Dec 16, 1940  Today's Date: 01/15/2014 Time: 9476-5465 Time Calculation (min): 45 min  Short Term Goals: Week 1:  PT Short Term Goal 1 (Week 1): = LTGs  Skilled Therapeutic Interventions/Progress Updates:    Pt aroused from sleep and slow processing initially before able to wake up completely -thought his son was in the room at first. Extra time for pt to don LSO EOB correctly. Focused on gait with RW off unit and outdoors for community mobility on uneven surfaces and to address overall endurance. Pt required more cues for RW safety outdoors and to stay inside RW than usual (attributed it to pt slow to completely wake up). Pt with need to use the bathroom while outside so assisted pt with toileting in the public bathroom downstairs and discussed inaccessibility (very very low toilet seat) of public bathrooms despite being labeled "Handicapped accessible". Discussed issues such as this out in community to be aware of. Pt require mod A to get off of toilet due to it being so low. Returned back to room and left with neuropsychologist for session.  Therapy Documentation Precautions:  Precautions Precautions: Back;Fall Precaution Comments: Pt able to recall 3/3 back precautions Required Braces or Orthoses: Spinal Brace Spinal Brace: Lumbar corset;Applied in sitting position Restrictions Weight Bearing Restrictions: No  Pain:  Denies pain.   See FIM for current functional status  Therapy/Group: Individual Therapy  Lars Masson 01/15/2014, 3:10 PM

## 2014-01-15 NOTE — Progress Notes (Signed)
Patient has home CPAP unit.  Stated that he would place on himself.  Was told if he had any questions to call RT.

## 2014-01-15 NOTE — Progress Notes (Signed)
[place] PHYSICAL MEDICINE & REHABILITATION     PROGRESS NOTE    Subjective/Complaints: Feeling much better. Bowels have normalized. Back pain controlled A 12 point review of systems has been performed and if not noted above is otherwise negative.   Objective: Vital Signs: Blood pressure 131/71, pulse 84, temperature 97.8 F (36.6 C), temperature source Oral, resp. rate 18, SpO2 97.00%. No results found. No results found for this basename: WBC, HGB, HCT, PLT,  in the last 72 hours  Recent Labs  01/13/14 0625  NA 145  K 4.1  CL 109  GLUCOSE 101*  BUN 36*  CREATININE 1.21  CALCIUM 10.9*   CBG (last 3)  No results found for this basename: GLUCAP,  in the last 72 hours  Wt Readings from Last 3 Encounters:  01/05/14 91.354 kg (201 lb 6.4 oz)  01/05/14 91.354 kg (201 lb 6.4 oz)  12/29/13 88.905 kg (196 lb)    Physical Exam:  HENT: tongue is dry Head: Normocephalic.  Eyes: EOM are normal.  Neck: Normal range of motion. Neck supple. No thyromegaly present.  Cardiovascular: Normal rate and regular rhythm.  Respiratory: Effort normal and breath sounds normal. No respiratory distress.  GI: Soft. Bowel sounds are normal. He exhibits no distension.  Neurological:  Patient is still a bit lethargic but arousable. He was able to provide his name and age as well as place. He follows simple commands. Needs extra time for orientation.   Bilateral deltoid, bicep, tricep, grip 4/5. LE , bilateral hip flexors 3 minus bilaterally knee extensors 4 minus, bilateral ankle dorsiflexors 4 minus  Sensory difficult to assess secondary to his slowed mental processing  Skin:  Back incision clean, intact wound well approximated without drainage Psychiatric:  Flat, fatigued  Musculoskeletal: minimal pain in LE's or UE with PROM or AROM today  Assessment/Plan: 1. Functional deficits secondary to Lumbar spondylolisthesis with radiculopathy/severe polyarticular gout. Status post laminectomy,  foraminotomies discectomy 01/06/2014  which require 3+ hours per day of interdisciplinary therapy in a comprehensive inpatient rehab setting. Physiatrist is providing close team supervision and 24 hour management of active medical problems listed below. Physiatrist and rehab team continue to assess barriers to discharge/monitor patient progress toward functional and medical goals.   FIM: FIM - Bathing Bathing Steps Patient Completed: Chest;Right Arm;Left Arm;Abdomen;Front perineal area;Right upper leg;Left upper leg Bathing: 3: Mod-Patient completes 5-7 95f 10 parts or 50-74%  FIM - Upper Body Dressing/Undressing Upper body dressing/undressing steps patient completed: Thread/unthread right sleeve of pullover shirt/dresss;Pull shirt over trunk;Thread/unthread left sleeve of pullover shirt/dress;Put head through opening of pull over shirt/dress Upper body dressing/undressing: 5: Set-up assist to: Obtain clothing/put away FIM - Lower Body Dressing/Undressing Lower body dressing/undressing steps patient completed: Pull underwear up/down;Fasten/unfasten pants;Pull pants up/down Lower body dressing/undressing: 2: Max-Patient completed 25-49% of tasks  FIM - Toileting Toileting steps completed by patient: Adjust clothing prior to toileting;Performs perineal hygiene;Adjust clothing after toileting Toileting: 0: Activity did not occur  FIM - Air cabin crew Transfers Assistive Devices: Elevated toilet seat;Grab bars;Walker Toilet Transfers: 0-Activity did not occur  FIM - Control and instrumentation engineer Devices: Walker;Orthosis;HOB elevated;Bed rails Bed/Chair Transfer: 5: Supine > Sit: Supervision (verbal cues/safety issues);4: Bed > Chair or W/C: Min A (steadying Pt. > 75%)  FIM - Locomotion: Wheelchair Locomotion: Wheelchair: 0: Activity did not occur FIM - Locomotion: Ambulation Locomotion: Ambulation Assistive Devices: Walker - Rolling;Orthosis Ambulation/Gait  Assistance: 4: Min guard Locomotion: Ambulation: 2: Travels 50 - 149 ft with minimal assistance (Pt.>75%)  Comprehension Comprehension Mode: Auditory Comprehension: 5-Understands basic 90% of the time/requires cueing < 10% of the time  Expression Expression Mode: Verbal Expression: 4-Expresses basic 75 - 89% of the time/requires cueing 10 - 24% of the time. Needs helper to occlude trach/needs to repeat words.  Social Interaction Social Interaction: 5-Interacts appropriately 90% of the time - Needs monitoring or encouragement for participation or interaction.  Problem Solving Problem Solving: 4-Solves basic 75 - 89% of the time/requires cueing 10 - 24% of the time  Memory Memory: 4-Recognizes or recalls 75 - 89% of the time/requires cueing 10 - 24% of the time  Medical Problem List and Plan:  1. 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 Status post laminectomy, foraminotomies discectomy 01/06/2014  2. DVT Prophylaxis/Anticoagulation: Subcutaneous heparin. Monitor platelet counts and any signs of bleeding  3. Pain Management: Robaxin and hydrocodone as needed. Monitor with increased mobility.  4. Neuropsych: This patient is capable of making decisions on his own behalf.  5. Gout. Colchicine 0.6 mg 3 times a day (hold for now---resume at lower dose if needed), continue Uloric 40 mg daily as needed gout flareup.  6. BPH. Flomax 0.4 mg daily. Check PVRs x3  7. Hypertension. Norvasc 5 mg daily. Blood pressure is under good control and monitored  8. Obstructive sleep apnea. CPAP  9. Diarrhea: resolved essentially   -Prn imodium  -probiotic  -encourage fluids   -avoid colchicine for now LOS (Days) 4 A FACE TO FACE EVALUATION WAS PERFORMED  Meredith Staggers 01/15/2014 8:14 AM

## 2014-01-15 NOTE — Progress Notes (Signed)
Occupational Therapy Session Notes  Patient Details  Name: [name] MRN: [id number] Date of Birth: 1940/12/24  Today's Date: 01/15/2014  Short Term Goals: Week 1:  OT Short Term Goal 1 (Week 1): Short Term Goals = Long Term Goals  Skilled Therapeutic Interventions/Progress Updates:   Session #1 [phone] - 57 Minutes Individual Therapy Patient with 3/10 complaints of pain in lower back, RN aware Patient received supine in bed. Patient engaged in bed mobility and sat EOB for donning of lumbar corset. Patient then ambulated > bathroom for shower stall transfer and ADL retraining at shower level in seated position (seated position secondary to lumbar corset wearing orders). Shirt and lumbar corset donned in shower after bathing, then patient ambulated > w/c after shower for UB/LB dressing. Patient used long handled sponge and reacher during ADL in order to increase independence with LB ADLs. Grooming tasks completed at sink in sit<>stand position at independent>supervision level. At end of session, left patient seated in w/c beside bed with all needs within reach.   Session #2 [phone] - 30 Minutes Individual Therapy No complaints of pain Patient received supine in bed. Patient eager to learn UB exercises to perform in-between therapies. Therapist educated patient on BUE HEP using theraband. Patient return demonstrated each exercise.  Therapist wrote out exercises on paper. Encouraged patient to perform 2-3 sets of 10 reps throughout the day. At end of session, left patient supine in bed with all needs within reach. Patient with lunch tray, but unable to eat sandwich per his report; therapist dialed nutritional services for patient to discuss food choices.   Upgraded goals to an overall mod I level for BADLs.   Precautions:  Precautions Precautions: Back;Fall Precaution Comments: Pt able to recall 3/3 back precautions Required Braces or Orthoses: Spinal Brace Spinal Brace: Lumbar  corset;Applied in sitting position Restrictions Weight Bearing Restrictions: No  See FIM for current functional status  [name] 01/15/2014, 7:24 AM

## 2014-01-16 ENCOUNTER — Inpatient Hospital Stay (HOSPITAL_COMMUNITY): Payer: Medicare Other | Admitting: Physical Therapy

## 2014-01-16 ENCOUNTER — Inpatient Hospital Stay (HOSPITAL_COMMUNITY): Payer: Medicare Other

## 2014-01-16 ENCOUNTER — Encounter (HOSPITAL_COMMUNITY): Payer: Medicare Other | Admitting: Occupational Therapy

## 2014-01-16 DIAGNOSIS — IMO0002 Reserved for concepts with insufficient information to code with codable children: Secondary | ICD-10-CM

## 2014-01-16 LAB — BASIC METABOLIC PANEL
BUN: 23 mg/dL (ref 6–23)
CALCIUM: 10.5 mg/dL (ref 8.4–10.5)
CHLORIDE: 104 meq/L (ref 96–112)
CO2: 25 mEq/L (ref 19–32)
Creatinine, Ser: 1.17 mg/dL (ref 0.50–1.35)
GFR, EST AFRICAN AMERICAN: 70 mL/min — AB (ref >90)
GFR, EST NON AFRICAN AMERICAN: 60 mL/min — AB (ref >90)
Glucose, Bld: 113 mg/dL — ABNORMAL HIGH (ref 70–99)
Potassium: 4.5 mEq/L (ref 3.7–5.3)
Sodium: 141 mEq/L (ref 137–147)

## 2014-01-16 MED ORDER — ENSURE COMPLETE PO LIQD: 237.0000 mL | Freq: Two times a day (BID) | ORAL | Status: DC

## 2014-01-16 NOTE — Progress Notes (Signed)
Physical Therapy Note  Patient Details  Name: Toy Eisemann MRN: [id number] Date of Birth: September 10, 1941 Today's Date: 01/16/2014  Time: [phone] 60 minutes  1:1 No c/o pain, pt rec'd pain meds prior to session.  Sit to stand multiple attempts for donning pants/underwear, close supervision, cues for safety.  Gait with RW with supervision > 150', no LOB.  Gait training without AD with min A 50' x 2, increased B knee flexion.  Stair training x flight of stairs with close supervision, good safety awareness.  Standing therex 2 x 10 hip abd, HS curl, heel raise, mini squats.  Nu step in range without increased hip flexion x 6 minutes for UE/LE strengthening.  Pt with good activity tolerance, motivated to improve.   Kennith Gain 01/16/2014, 10:49 AM

## 2014-01-16 NOTE — Progress Notes (Signed)
Physical Therapy Session Note  Patient Details  Name: Sumeet Geter MRN: [id number] Date of Birth: Jun 08, 1941  Today's Date: 01/16/2014  Short Term Goals: Week 1:  PT Short Term Goal 1 (Week 1): = LTGs  Session #1 Time: 1305-1350 (45 min) Premedicated for low back pain. Gait with RW to/from therapy gym and on unit with overall S for general strengthening and endurance; 1 episode requiring cues for safety with positioning of RW during turn. Nustep with LE's only on level 4 x 10 min for increased strength and overall endurance. Gait training without AD to challenge balance through obstacle course to practice household environment with turns, sidestepping and stepping over simulated threshold with overall min A. Returned to bed end of session with overall S and doffed LSO independently EOB.   Session #2 Time: 1435-1505 (30 min) Denies pain. Pt performed bed mobility and donned brace EOB independently. Focused session on community mobility with RW outdoors on uneven surfaces and negotiating the elevator and various obstacles with overall S. Pt only requiring 2 standing rest breaks. Returned back to bed end of session to rest his back and doffed brace independently.   Therapy Documentation Precautions:  Precautions Precautions: Back;Fall Precaution Comments: Pt able to recall 3/3 back precautions Required Braces or Orthoses: Spinal Brace Spinal Brace: Lumbar corset;Applied in sitting position Restrictions Weight Bearing Restrictions: No  See FIM for current functional status  Therapy/Group: Individual Therapy  Lars Masson 01/16/2014, 3:10 PM

## 2014-01-16 NOTE — Progress Notes (Signed)
INITIAL NUTRITION ASSESSMENT  DOCUMENTATION CODES Per approved criteria  -Not Applicable   INTERVENTION: 1.  Supplements; Ensure Complete po BID, each supplement provides 350 kcal and 13 grams of protein 2.  General healthful diet; encourage intake of foods and beverages as able.  RD to follow and assess for nutritional adequacy.  3.  Brief education; high protein foods education provided.  NUTRITION DIAGNOSIS: Inadequate nutrient intake; protein related to vegetarian diet with additional restrictions as evidenced by pt report.   Monitor:  1.  Food/Beverage; pt meeting >/=90% estimated needs with tolerance. 2.  Wt/wt change; monitor trends  Reason for Assessment: RN request  73 y.o. male  Admitting Dx: deconditioning  ASSESSMENT: Pt admitted with deconditioning after back surgery.  RD met with pt who is difficult to focus during assessment.  RD introduced name and role.  Discussed reason for visit- to assist pt in meeting nutrition needs while on rehab.  Pt states concern over the fact that he is not on his "Gout medicine."  He states "I need to get out of here so I can see my doctor about my gout."  RD attempted to redirect conversation about how to manage gout and meet nutrition needs, however pt states "I'm not staying here to the 5th month 2nd day."  RD notes pt's discharge date of 5/2 is written on the white board.  RD again reoriented pt to goals of visit and the importance of nutrition in helping him manage his disease and get home. Pt is agreeable to protein shakes.  Will add Ensure.   Height: Ht Readings from Last 1 Encounters:  01/05/14 5' 9"  (1.753 m)    Weight: Wt Readings from Last 1 Encounters:  01/05/14 201 lb 6.4 oz (91.354 kg)    Ideal Body Weight: 72.7 kg  % Ideal Body Weight: 125%  Wt Readings from Last 10 Encounters:  01/05/14 201 lb 6.4 oz (91.354 kg)  01/05/14 201 lb 6.4 oz (91.354 kg)  12/29/13 196 lb (88.905 kg)  11/27/13 197 lb (89.359 kg)     Usual Body Weight: 200 lbs  % Usual Body Weight: 100%  BMI:  There is no weight on file to calculate BMI.  Estimated Nutritional Needs: Kcal: 2080-2280 Protein: 73-90g Fluid: ~2.0 L/day  Skin: intact  Diet Order: Cardiac  EDUCATION NEEDS: -Education needs addressed   Intake/Output Summary (Last 24 hours) at 01/16/14 1408 Last data filed at 01/16/14 1230  Gross per 24 hour  Intake    960 ml  Output    725 ml  Net    235 ml    Last BM: 4/28  Labs:   Recent Labs Lab 01/12/14 0604 01/13/14 0625 01/16/14 0608  NA 143 145 141  K 4.2 4.1 4.5  CL 107 109 104  CO2 21 22 25   BUN 40* 36* 23  CREATININE 1.27 1.21 1.17  CALCIUM 10.7* 10.9* 10.5  GLUCOSE 95 101* 113*    CBG (last 3)  No results found for this basename: GLUCAP,  in the last 72 hours  Scheduled Meds: . amLODipine  5 mg Oral Daily  . fentaNYL  25 mcg Transdermal Q72H  . heparin  5,000 Units Subcutaneous 3 times per day  . tamsulosin  0.4 mg Oral Daily    Continuous Infusions:   Past Medical History  Diagnosis Date  . Hypertension   . Sleep apnea     cpap 7 yrs  . History of kidney stones   . Arthritis   .  Gout   . Cancer 01    colon    Past Surgical History  Procedure Laterality Date  . Colon surgery  01    ca  . Tonsillectomy    . Appendectomy      Brynda Greathouse, MS RD LDN Clinical Inpatient Dietitian Pager: [phone] Weekend/After hours pager: [phone]

## 2014-01-16 NOTE — Progress Notes (Signed)
Occupational Therapy Session Note  Patient Details  Name: [name] MRN: [id number] Date of Birth: 12-Oct-1940  Today's Date: 01/16/2014 Time: 1100-1200 Time Calculation (min): 60 min  Short Term Goals: Week 1:  OT Short Term Goal 1 (Week 1): Short Term Goals = Long Term Goals  Skilled Therapeutic Interventions/Progress Updates:  Patient received seated in w/c beside bed. Patient eager to work with therapist and take shower. Patient stood with RW and ambulated around room to gather necessary items for his ADL, therapist educated patient on RW safety during this. From here, patient ambulated > bathroom for shower stall transfer and UB/LB bathing & dressing at shower level in seated position secondary to lumbar corset wearing orders. Patient required moderate verbal cues for safety and in order to adhere to lumbar corset wearing orders while doffing clothes. Patient completed UB dressing seated on tub bench and ambulated > w/c for LB dressing tasks. Patient used long handled sponge, reacher, and long handled shoe horn in order to increase independence with LB tasks. Patient sat at sink for grooming tasks of brushing teeth and shaving. At end of session, left patient seated in w/c with all needs within reach.   Precautions:  Precautions Precautions: Back;Fall Precaution Comments: Pt able to recall 3/3 back precautions Required Braces or Orthoses: Spinal Brace Spinal Brace: Lumbar corset;Applied in sitting position Restrictions Weight Bearing Restrictions: No  See FIM for current functional status  Therapy/Group: Individual Therapy  [name] 01/16/2014, 12:08 PM

## 2014-01-16 NOTE — Consult Note (Signed)
NEUROBEHAVIORAL STATUS EXAM - [facility]   MEDICAL NECESSITY:  [name] was seen on the [facility] [facility] for a neurobehavioral status exam owing to the patient's diagnosis of radiculopathy. There is also concern for a preexisting dementia.   According to medical records, [name] was admitted to the rehab unit owing to "Functional deficits secondary to lumbar stenosis status post lumbar decompression surgery." He has a history of hypertension, sleep apnea, and gout. He was reportedly admitted on 01/04/2014 with progressive low back pain over the last 2 years. 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 He underwent bilateral L3 laminectomy, foraminotomy bilateral L4-5 discectomy posterior lateral arthrodesis on 01/06/2014.   During today's appointment, [name] admitted to experiencing improving cognitive difficulties including "clouded" thinking, memory loss, and decreased attention/concentration.  He feels that these issues are owing to oxycodone use and that they are abating because he is no longer using this medication.   From an emotional standpoint, [name] said that he felt "down" when he first came to the rehab unit. His mood is purportedly improving. As is common, he was hoping that he would recovery much quicker than he is but he realizes that it is going to take some time.   [name] feels that he is making good gains in therapy and described the staff as "great." He has a fantastic social support system that includes multiple family members and friends.   Of note, [name] social worker conveyed to this provider that she noticed the patient to be in significant emotional distress during her meeting. He was crying uncontrollably and was unable to accurately convey details about his history. The patient's wife  told the social worker that there was concern for dementia prior to his surgery but he was not tested.   PROCEDURES ADMINISTERED: [2 units [id number] on 01/15/14] Diagnostic clinical interview  Review of available records Mental Status Exam-2 (brief version)  MENTAL STATUS: [name] mental status exam score of 13/16 is below expectation but above the cutoff used to indicate blatant dementia. He was fully oriented to person, place, time and situation. He was able to immediately register 3 words into memory but he could not freely recall any of them after a very short delay. With recognition cueing, he recalled all 3 words.   Emotional & Behavioral Evaluation: [name] was appropriately dressed for season and situation, and he appeared tidy and well-groomed. Normal posture was noted. He was friendly and rapport was easily established. His speech was as expected and he was able to express ideas effectively. He seemed to understand test directions readily. His affect was appropriately modulated. Attention and motivation were good. Optimal test taking conditions were maintained.  From an emotional standpoint, [name] denied suffering from any signs of clinical psychopathology. He is adjusting well to his hospital stay. Suicidal/homicidal ideation, plan or intent was denied. No manic or hypomanic episodes were reported. The patient denied ever experiencing any auditory/visual hallucinations. No major behavioral or personality changes were endorsed.    Overall, [name] seems to be experiencing memory retrieval deficits that require further evaluation. No major mood symptoms were endorsed despite his social worker's observations. It is feasible that he may have been suffering from medication induced issues and that was what his social worker witnessed but there have reportedly been concerns for dementia that precede his surgery. As such, I plan to have  him seen by my colleague Dr. Beverly Gust this Thursday for  a more comprehensive neuropsychological screen. More recommendations will follow that evaluation.    RECOMMENDATIONS    Complete more thorough neuropsychological screen this Thursday with Dr. Beverly Gust.   DIAGNOSES:  Memory lapses or loss  Radiculopathy   Rutha Bouchard, Psy.D.  Clinical Neuropsychologist

## 2014-01-16 NOTE — Progress Notes (Signed)
[facility]     PROGRESS NOTE    Subjective/Complaints: Had another good day. Slept well. Pain under control.  A 12 point review of systems has been performed and if not noted above is otherwise negative.   Objective: Vital Signs: Blood pressure 127/73, pulse 82, temperature 98 F (36.7 C), temperature source Oral, resp. rate 18, SpO2 99.00%. No results found. No results found for this basename: WBC, HGB, HCT, PLT,  in the last 72 hours  Recent Labs  01/16/14 0608  NA 141  K 4.5  CL 104  GLUCOSE 113*  BUN 23  CREATININE 1.17  CALCIUM 10.5   CBG (last 3)  No results found for this basename: GLUCAP,  in the last 72 hours  Wt Readings from Last 3 Encounters:  01/05/14 91.354 kg (201 lb 6.4 oz)  01/05/14 91.354 kg (201 lb 6.4 oz)  12/29/13 88.905 kg (196 lb)    Physical Exam:  HENT: tongue is dry Head: Normocephalic.  Eyes: EOM are normal.  Neck: Normal range of motion. Neck supple. No thyromegaly present.  Cardiovascular: Normal rate and regular rhythm.  Respiratory: Effort normal and breath sounds normal. No respiratory distress.  GI: Soft. Bowel sounds are normal. He exhibits no distension.  Neurological:  Patient is still a bit lethargic but arousable. He was able to provide his name and age as well as place. He follows simple commands. Needs extra time for orientation.   Bilateral deltoid, bicep, tricep, grip 4/5. LE , bilateral hip flexors 3 minus bilaterally knee extensors 4 minus, bilateral ankle dorsiflexors 4 minus  Sensory difficult to assess secondary to his slowed mental processing  Skin:  Back incision clean, intact wound well approximated without drainage Psychiatric:  Flat, fatigued  Musculoskeletal: minimal pain in LE's or UE with PROM or AROM today  Assessment/Plan: 1. Functional deficits secondary to Lumbar spondylolisthesis with radiculopathy/severe polyarticular gout. Status post laminectomy, foraminotomies  discectomy 01/06/2014  which require 3+ hours per day of interdisciplinary therapy in a comprehensive inpatient rehab setting. Physiatrist is providing close team supervision and 24 hour management of active medical problems listed below. Physiatrist and rehab team continue to assess barriers to discharge/monitor patient progress toward functional and medical goals.   FIM: FIM - Bathing Bathing Steps Patient Completed: Chest;Right Arm;Left Arm;Abdomen;Front perineal area;Right upper leg;Left upper leg;Buttocks;Right lower leg (including foot);Left lower leg (including foot) Bathing: 5: Supervision: Safety issues/verbal cues  FIM - Upper Body Dressing/Undressing Upper body dressing/undressing steps patient completed: Thread/unthread right sleeve of pullover shirt/dresss;Pull shirt over trunk;Thread/unthread left sleeve of pullover shirt/dress;Put head through opening of pull over shirt/dress Upper body dressing/undressing: 5: Set-up assist to: Obtain clothing/put away FIM - Lower Body Dressing/Undressing Lower body dressing/undressing steps patient completed: Pull underwear up/down;Fasten/unfasten pants;Pull pants up/down;Thread/unthread right underwear leg;Thread/unthread left underwear leg;Thread/unthread right pants leg;Thread/unthread left pants leg Lower body dressing/undressing: 4: Min-Patient completed 75 plus % of tasks  FIM - Toileting Toileting steps completed by patient: Adjust clothing prior to toileting;Performs perineal hygiene;Adjust clothing after toileting Toileting: 0: Activity did not occur  FIM - Air cabin crew Transfers Assistive Devices: Elevated toilet seat;Grab bars;Walker Personnel officer: 0-Activity did not occur  FIM - Control and instrumentation engineer Devices: Chiropractor: 6: Supine > Sit: No assist;6: Sit > Supine: No assist;5: Bed > Chair or W/C: Supervision (verbal cues/safety issues);5: Chair or W/C > Bed:  Supervision (verbal cues/safety issues)  FIM - Locomotion: Wheelchair Locomotion: Wheelchair: 0: Activity did not occur FIM -  Locomotion: Ambulation Locomotion: Ambulation Assistive Devices: Walker - Rolling;Orthosis Ambulation/Gait Assistance: 4: Min guard Locomotion: Ambulation: 5: Travels 150 ft or more with supervision/safety issues  Comprehension Comprehension Mode: Auditory Comprehension: 5-Understands complex 90% of the time/Cues < 10% of the time  Expression Expression Mode: Verbal Expression: 5-Expresses complex 90% of the time/cues < 10% of the time  Social Interaction Social Interaction: 5-Interacts appropriately 90% of the time - Needs monitoring or encouragement for participation or interaction.  Problem Solving Problem Solving: 5-Solves complex 90% of the time/cues < 10% of the time  Memory Memory: 5-Recognizes or recalls 90% of the time/requires cueing < 10% of the time  Medical Problem List and Plan:  1. 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 Status post laminectomy, foraminotomies discectomy 01/06/2014  2. DVT Prophylaxis/Anticoagulation: Subcutaneous heparin.    3. Pain Management: Robaxin and hydrocodone as needed.  4. Neuropsych: This patient is capable of making decisions on his own behalf.  5. Gout. Colchicine 0.6 mg 3 times a day held for the time being due to diarrhea.   -continue Uloric 40 mg daily as needed gout flareup.  6. BPH. Flomax 0.4 mg daily.    7. Hypertension. Norvasc 5 mg daily. Blood pressure is under good control and monitored  8. Obstructive sleep apnea. CPAP  9. Diarrhea: resolved    -Prn imodium  -probiotic  -encourage fluids --bmet normal today  -avoid colchicine for now LOS (Days) 5 A FACE TO FACE EVALUATION WAS PERFORMED  Meredith Staggers 01/16/2014 8:17 AM

## 2014-01-16 NOTE — Progress Notes (Deleted)
PMR Admission Coordinator Pre-Admission Assessment  Patient: [name] is an 73 y.o., male MRN: [id number] DOB: 09-26-1940 Height:   Weight:                Insurance Information HMO: yes    PPO:      PCP:      IPA:      80/20:      OTHER:  PRIMARY: Blue Medicare      Policy#: [id number]      Subscriber: pt CM Name: [name]      Phone#: [phone]     Fax#: [phone] Pre-Cert#: [id number]      Employer: retired Benefits:  Phone #: [phone]     Name: 4/21 Eff. Date: 09/21/13     Deduct: none      Out of Pocket Max: $4500      Life Max: none CIR: $250 copay per day days 1-6 then covers 100%      SNF: no copay days 1-20; $60 copay per day days 21-100 Outpatient: $35 per visit     Co-Pay: no visit limit Home Health: 100%      Co-Pay: none DME: 80%     Co-Pay: 20% Providers: in network  SECONDARY: none        Medicaid Application Date:       Case Manager:  Disability Application Date:       Case Worker:   Emergency Contact Information Contact Information   Name Relation Home Work Mobile   Lozon,Carolyn Spouse [phone]  [phone]     Current Medical History  Patient Admitting Diagnosis: lumbar spondylolisthesis, severe polyarticular gout  History of Present Illness: [name] is a 73 y.o. right-handed male with history of hypertension as well as sleep apnea and gout. Patient was independent prior to admission living with his wife. Admitted 01/04/2014 with progressive low back pain that has progressed over the last 2 years. No relief with conservative care including epidural injections. 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 Underwent bilateral L3 laminectomy, foraminotomy bilateral L4-5 discectomy posterior lateral arthrodesis 01/06/2014 per Dr. Joya Salm. Postoperative pain management. Back brace when out of bed applied in sitting position. Subcutaneous heparin for DVT prophylaxis.    Past Medical History  Past Medical History  Diagnosis Date  . Hypertension   . Sleep apnea     cpap 7 yrs  . History of kidney stones   . Arthritis   . Gout   . Cancer 01    colon    Family History  family history is not on file.  Prior Rehab/Hospitalizations: none   Current Medications  Current facility-administered medications:acetaminophen (TYLENOL) tablet 325-650 mg, 325-650 mg, Oral, Q4H PRN, Lavon Paganini Angiulli, PA-C;  amLODipine (NORVASC) tablet 5 mg, 5 mg, Oral, Daily, Lavon Paganini Angiulli, PA-C, 5 mg at 01/16/14 0743;  bisacodyl (DULCOLAX) EC tablet 5 mg, 5 mg, Oral, Daily PRN, Lavon Paganini Angiulli, PA-C;  febuxostat (ULORIC) tablet 40 mg, 40 mg, Oral, Daily PRN, Lavon Paganini Angiulli, PA-C fentaNYL (DURAGESIC - dosed mcg/hr) patch 25 mcg, 25 mcg, Transdermal, Q72H, Evie Lacks Plotnikov, MD, 25 mcg at 01/16/14 1027;  heparin injection 5,000 Units, 5,000 Units, Subcutaneous, 3 times per day, Cathlyn Parsons, PA-C, 5,000 Units at 01/16/14 1431;  HYDROcodone-acetaminophen (NORCO) 10-325 MG per tablet 2 tablet, 2 tablet, Oral, Q4H PRN, Lavon Paganini Angiulli, PA-C, 2 tablet at 01/16/14 2836 loperamide (IMODIUM) capsule 2 mg, 2 mg, Oral, Q6H PRN,  Meredith Staggers, MD, 2 mg at 01/15/14 1714;  methocarbamol (ROBAXIN) tablet 500 mg, 500 mg, Oral, Q6H PRN, Lavon Paganini Angiulli, PA-C, 500 mg at 01/12/14 1732;  ondansetron [facility]) injection 4 mg, 4 mg, Intravenous, Q6H PRN, Lavon Paganini Angiulli, PA-C;  ondansetron [facility]) tablet 4 mg, 4 mg, Oral, Q6H PRN, Lavon Paganini Angiulli, PA-C, 4 mg at 01/14/14 2050 sorbitol 70 % solution 30 mL, 30 mL, Oral, Daily PRN, Lavon Paganini Angiulli, PA-C;  tamsulosin [facility]) capsule 0.4 mg, 0.4 mg, Oral, Daily, Lavon Paganini Angiulli, PA-C, 0.4 mg at 01/16/14 3546  Patients Current Diet: Cardiac  Precautions / Restrictions Precautions Precautions: Back;Fall Precaution Comments: Pt able to recall 3/3 back precautions Spinal Brace: Lumbar corset;Applied in sitting  position Restrictions Weight Bearing Restrictions: No   Prior Activity Level    Home Assistive Devices / Equipment    Prior Functional Level Prior Function Comments: Retired from Scientist, clinical (histocompatibility and immunogenetics). Spends a lot of time with his grandchildren.  Current Functional Level Cognition  Arousal/Alertness: Awake/alert Overall Cognitive Status: Within Functional Limits for tasks assessed Orientation Level: Oriented X4 Comments: Pt appears to be very emotional (tearful during session when speaking of his grandkids), some difficulty with recall of hospital course (wife would fill in and correct), and pt very limited by pain. Will continue to assess and monitor. Follows all commands and is appropriate throughout session though.    Extremity Assessment (includes Sensation/Coordination)          ADLs       Mobility       Transfers       Ambulation / Gait / Stairs / Wheelchair Mobility       Posture / Balance Static Sitting Balance Static Sitting - Level of Assistance: 5: Stand by assistance Dynamic Sitting Balance Dynamic Sitting - Level of Assistance: 5: Stand by assistance Sitting balance - Comments: still lists left, but can correct today Static Standing Balance Static Standing - Level of Assistance: 4: Min assist (with RW) Dynamic Standing Balance Dynamic Standing - Level of Assistance: 4: Min assist (with RW)    Special needs/care consideration BiPAP/CPAP yes has home CPAP Bowel mgmt: continent but has had diarrhea since enema postop Bladder mgmt:continent    Previous Home Environment Living Arrangements: Spouse/significant other  Lives With: Spouse Available Help at Discharge: Family;Available 24 hours/day Type of Home: House Home Layout: One level Home Access: Stairs to enter Entrance Stairs-Rails: None Entrance Stairs-Number of Steps: 2 Bathroom Shower/Tub: Geophysical data processor: Handicapped height Bathroom Accessibility: Yes How Accessible:  Accessible via walker  Discharge Living Setting Does the patient have any problems obtaining your medications?: No  Social/Family/Support Systems Patient Roles: Spouse;Parent Anticipated Caregiver: wife and son Ability/Limitations of Caregiver: no limitations Caregiver Availability: 24/7    Goals/Additional Needs Expected length of stay: ELOS 7-10 days    Decrease burden of Care through IP rehab admission: n/a  Possible need for SNF placement upon discharge: n/a  Patient Condition: This patient's medical and functional status has changed since the consult dated: 01/09/14 in which the Rehabilitation Physician determined and documented that the patient's condition is appropriate for intensive rehabilitative care in an inpatient rehabilitation facility. See "History of Present Illness" (above) for medical update. Functional changes are: overall min assist. Patient's medical and functional status update has been discussed with the Rehabilitation physician and patient remains appropriate for inpatient rehabilitation. Will admit to inpatient rehab today.  Preadmission Screen Completed By:  Cleatrice Burke, 01/16/2014 3:14 PM ______________________________________________________________________   Discussed status  with Dr. Letta Pate on 01/11/14 at  1211 and received telephone approval for admission today.  Admission Coordinator:  Cleatrice Burke, time 3545 Date 01/11/14.

## 2014-01-17 ENCOUNTER — Inpatient Hospital Stay (HOSPITAL_COMMUNITY): Payer: Medicare Other

## 2014-01-17 ENCOUNTER — Encounter (HOSPITAL_COMMUNITY): Payer: Medicare Other | Admitting: Occupational Therapy

## 2014-01-17 ENCOUNTER — Inpatient Hospital Stay (HOSPITAL_COMMUNITY): Payer: Medicare Other | Admitting: Occupational Therapy

## 2014-01-17 DIAGNOSIS — IMO0002 Reserved for concepts with insufficient information to code with codable children: Secondary | ICD-10-CM

## 2014-01-17 MED ORDER — SACCHAROMYCES BOULARDII 250 MG PO CAPS
250.0000 mg | ORAL_CAPSULE | Freq: Two times a day (BID) | ORAL | Status: DC
  Administered 2014-01-17 – 2014-01-19 (×5): 250 mg via ORAL
  Filled 2014-01-17 (×7): qty 1

## 2014-01-17 MED ORDER — ENSURE COMPLETE PO LIQD: 237.0000 mL | Freq: Two times a day (BID) | ORAL | Status: DC | PRN

## 2014-01-17 NOTE — Progress Notes (Signed)
Physical Therapy Session Note  Patient Details  Name: [name] MRN: [id number] Date of Birth: 1940-12-13  Today's Date: 01/17/2014 Time: 0930-1028 Time Calculation (min): 58 min  Short Term Goals: Week 1:  PT Short Term Goal 1 (Week 1): = LTGs  Skilled Therapeutic Interventions/Progress Updates:   Session focused on functional mobility with RW overall S, dynamic standing balance and balance reaction training on compliant surface while reaching for horseshoes outside BOS but maintaining back precautions with intermittent steady A, stair negotiation with bilateral rails overall S approaching mod I demonstrating good safety awareness, and Nustep for general LE strengthening and endurance x 10 min on level 4. Pt returned to bed end of session with S and doffed brace independently EOB.   Therapy Documentation Precautions:  Precautions Precautions: Back;Fall Precaution Comments: Pt able to recall 3/3 back precautions Required Braces or Orthoses: Spinal Brace Spinal Brace: Lumbar corset;Applied in sitting position Restrictions Weight Bearing Restrictions: No Pain: C/o back pain from sitting in w/c - improved with mobility.  See FIM for current functional status  Therapy/Group: Individual Therapy  Lars Masson 01/17/2014, 11:06 AM

## 2014-01-17 NOTE — Patient Care Conference (Signed)
Inpatient RehabilitationTeam Conference and Plan of Care Update Date: 01/16/2014   Time: 2:10 PM    Patient Name: [name]      Medical Record Number: [id number]  Date of Birth: 09/02/1941 Sex: Male         Room/Bed: 4W22C/4W22C-01 Payor Info: Payor: [place] OF  MEDICARE / Plan: BLUE MEDICARE / Product Type: *No Product type* /    Admitting Diagnosis: LUMAR SURGERY  Admit Date/Time:  01/11/2014  4:57 PM Admission Comments: No comment available   Primary Diagnosis:  <principal problem not specified> Principal Problem: <principal problem not specified>  Patient Active Problem List   Diagnosis Date Noted  . Radiculopathy 01/11/2014  . Spondylolisthesis of lumbar region 01/05/2014    Expected Discharge Date: Expected Discharge Date: 01/19/14  Team Members Present: Physician leading conference: Dr. Alger Simons Social Worker Present: Lennart Pall, LCSW Nurse Present: Elliot Cousin, RN PT Present: Canary Brim, Cecille Rubin, PT OT Present: Chrys Racer, OT PPS Coordinator present : Daiva Nakayama, RN, CRRN;Becky Alwyn Ren, PT     Current Status/Progress Goal Weekly Team Focus  Medical   lumbar back surgery, severe gout. diarrhea  pain mgt, diarrhea control, symptom mgt  see above, FEN   Bowel/Bladder   Continent of bowel and bladder  Remain continent of bowel and bladder  Monitor    Swallow/Nutrition/ Hydration             ADL's   supervision>min assist  upgraded goals to an overall mod I level  ADL retraining, functional mobility, dynamic standing balance/tolerance/endurance, pain management, general functional strengtening   Mobility   S/steady A overall  upgraded goals to Mod I overall except S for stairs and car transfer  d/c planning, dynamic gait and balance, functional strengthening, endurance   Communication             Safety/Cognition/ Behavioral Observations            Pain   Pain level 0 on a scale of 0-10  Remain free of surgery pain   Monitor pain    Skin              Rehab Goals Patient on target to meet rehab goals: Yes *See Care Plan and progress notes for long and short-term goals.  Barriers to Discharge: pain, diarrhea, ?cogniton    Possible Resolutions to Barriers:  rx above, mod I goals    Discharge Planning/Teaching Needs:  Home with family to provide 24/7 supervision      Team Discussion:  Upgrading goals to overall modified independent.  Much improved physically and cognitively over past couple of days - cognitive likely r/t meds.  Fentanyl managing pain.  Loose stools decreased.    Revisions to Treatment Plan:  Goals upgraded overall   Continued Need for Acute Rehabilitation Level of Care: The patient requires daily medical management by a physician with specialized training in physical medicine and rehabilitation for the following conditions: Daily direction of a multidisciplinary physical rehabilitation program to ensure safe treatment while eliciting the highest outcome that is of practical value to the patient.: Yes Daily medical management of patient stability for increased activity during participation in an intensive rehabilitation regime.: Yes Daily analysis of laboratory values and/or radiology reports with any subsequent need for medication adjustment of medical intervention for : Post surgical problems;Other  Lennart Pall 01/17/2014, 9:29 AM

## 2014-01-17 NOTE — Progress Notes (Signed)
Pt. Has home bipap. Pt. States he can place on himself. RT informed pt. To notify if he needs any assistance.

## 2014-01-17 NOTE — Progress Notes (Signed)
Social Work Patient ID: [name], male   DOB: 01-Aug-1941, 73 y.o.   MRN: [id number]  Have reviewed team conference with pt and wife.  Both pleased with progress and agreeable with targeted d/c date of 5/1.  Discussed recommendation of OPPT, however, pt declines stating "I'll do my home exercises.  I've had enough therapy". Wife did not comment.  Have alerted CIR PT.    Lennart Pall, LCSW

## 2014-01-17 NOTE — Progress Notes (Signed)
PMR Admission Coordinator Pre-Admission Assessment  Patient: [name] is a 73 y.o., male  MRN: [id number]  DOB: 03/26/1941  Height: 5\' 9"  (175.3 cm)  Weight: 91.354 kg (201 lb 6.4 oz)  Insurance Information  HMO: yes PPO: PCP: IPA: 80/20: OTHER:  PRIMARY: Blue Medicare Policy#: [id number] Subscriber: pt  CM Name: Santiago Glad Phone#: [phone] Fax#: [phone]  Pre-Cert#: [id number] Employer: retired  Benefits: Phone #: [phone] Name: 4/21  Eff. Date: 09/21/13 Deduct: none Out of Pocket Max: $4500 Life Max: none  CIR: $250 copay per day days 1-6 then covers 100% SNF: no copay days 1-20; $60 copay per day days 21-100  Outpatient: $35 per visit Co-Pay: no visit limit  Home Health: 100% Co-Pay: none  DME: 80% Co-Pay: 20%  Providers: in network   SECONDARY: none  Medicaid Application Date: Case Manager:  Disability Application Date: Case Worker:  Emergency Contact Information  Contact Information    Name  Relation  Home  Work  Mobile    Kauk,Carolyn  Spouse  [phone]   [phone]      Current Medical History  Patient Admitting Diagnosis: lumbar spondylolisthesis, severe polyarticular gout  History of Present Illness: [name] is a 73 y.o. right-handed male with history of hypertension as well as sleep apnea and gout. Patient was independent prior to admission living with his wife. Admitted 01/04/2014 with progressive low back pain that has progressed over the last 2 years. No relief with conservative care including epidural injections. 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 Underwent bilateral L3 laminectomy, foraminotomy bilateral L4-5 discectomy posterior lateral arthrodesis 01/06/2014 per Dr. Joya Salm. Postoperative pain management. Back brace when out of bed applied in sitting position. Subcutaneous heparin for DVT prophylaxis.  Past Medical History  Past Medical History   Diagnosis   Date   .  Hypertension    .  Sleep apnea      cpap 7 yrs   .  History of kidney stones    .  Arthritis    .  Gout    .  Cancer  01     colon    Family History  family history is not on file.  Prior Rehab/Hospitalizations: none  Current Medications  Current facility-administered medications:0.9 % sodium chloride infusion, 250 mL, Intravenous, Continuous, Floyce Stakes, MD; 0.9 % sodium chloride infusion, , Intravenous, Continuous, Floyce Stakes, MD, Last Rate: 75 mL/hr at 01/06/14 2223, 75 mL/hr at 01/06/14 2223; acetaminophen (TYLENOL) suppository 650 mg, 650 mg, Rectal, Q4H PRN, Floyce Stakes, MD  acetaminophen (TYLENOL) tablet 650 mg, 650 mg, Oral, Q4H PRN, Floyce Stakes, MD, 650 mg at 01/11/14 0704; amLODipine (NORVASC) tablet 5 mg, 5 mg, Oral, Daily, Floyce Stakes, MD, 5 mg at 01/11/14 1001; bisacodyl (DULCOLAX) EC tablet 5 mg, 5 mg, Oral, Daily PRN, Floyce Stakes, MD, 5 mg at 01/08/14 2154; colchicine tablet 0.6 mg, 0.6 mg, Oral, TID, Floyce Stakes, MD, 0.6 mg at 01/11/14 1001  diphenhydrAMINE (BENADRYL) capsule 25 mg, 25 mg, Oral, Q6H PRN, Elaina Hoops, MD, 25 mg at 01/06/14 2115; febuxostat (ULORIC) tablet 40 mg, 40 mg, Oral, Daily PRN, Floyce Stakes, MD; heparin injection 5,000 Units, 5,000 Units, Subcutaneous, 3 times per day, Floyce Stakes, MD, 5,000 Units at 01/11/14 0704; HYDROcodone-acetaminophen (NORCO) 10-325 MG per tablet 1 tablet, 1 tablet, Oral, Q4H PRN, Floyce Stakes, MD  lactated ringers infusion, , Intravenous, Continuous, Albertha Ghee, MD,  Last Rate: 20 mL/hr at 01/05/14 0723; loperamide (IMODIUM) capsule 4 mg, 4 mg, Oral, PRN, Floyce Stakes, MD; menthol-cetylpyridinium (CEPACOL) lozenge 3 mg, 1 lozenge, Oral, PRN, Floyce Stakes, MD; ondansetron [facility]) injection 4 mg, 4 mg, Intravenous, Q4H PRN, Floyce Stakes, MD, 4 mg at 01/10/14 0943  phenol (CHLORASEPTIC) mouth spray 1 spray, 1 spray, Mouth/Throat, PRN, Floyce Stakes, MD; sodium  chloride 0.9 % injection 3 mL, 3 mL, Intravenous, Q12H, Floyce Stakes, MD, 3 mL at 01/11/14 1002; sodium chloride 0.9 % injection 3 mL, 3 mL, Intravenous, PRN, Floyce Stakes, MD; sodium phosphate (FLEET) 7-19 GM/118ML enema 1 enema, 1 enema, Rectal, Daily PRN, Floyce Stakes, MD, 1 enema at 01/09/14 0107  tamsulosin [facility]) capsule 0.4 mg, 0.4 mg, Oral, Daily, Floyce Stakes, MD, 0.4 mg at 01/11/14 1001  Patients Current Diet: Cardiac  Precautions / Restrictions  Precautions  Precautions: Back;Fall  Precaution Booklet Issued: Yes (comment)  Precaution Comments: Educated on precautions  Spinal Brace: Lumbar corset;Applied in sitting position  Restrictions  Weight Bearing Restrictions: No  Prior Activity Level  Community (5-7x/wk): active and independent  Development worker, international aid / Equipment  Home Assistive Devices/Equipment: CPAP;Eyeglasses;Raised toilet seat with rails;Shower chair without back  Home Equipment: [place] - 2 wheels;Cane - single point;Bedside commode;Shower seat;Wheelchair - Press photographer  Prior Functional Level  Prior Function  Level of Independence: Independent with assistive device(s)  Current Functional Level  Cognition  Overall Cognitive Status: Within Functional Limits for tasks assessed  Orientation Level: Oriented X4   Extremity Assessment  (includes Sensation/Coordination)     ADLs  Overall ADL's : Needs assistance/impaired  Eating/Feeding: Independent;Sitting  Grooming: Wash/dry face;Sitting;Set up;Supervision/safety  Upper Body Bathing: Sitting;Set up;Supervision/ safety  Lower Body Bathing: Sit to/from stand;Moderate assistance  Upper Body Dressing : Minimal assistance;Sitting (back brace)  Lower Body Dressing: Moderate assistance;With adaptive equipment;Sit to/from stand  Toilet Transfer: Minimal assistance;Stand-pivot  Toileting- Clothing Manipulation and Hygiene: Moderate assistance (standing)  Functional mobility during ADLs: Minimal  assistance;Rolling walker  General ADL Comments: Educated on use of cup for teeth care. Educated on AE for LB ADLs and pt practiced with reacher and sockaid. Educated on toilet aid for hygiene if needed. Pt washed peri area and tops of legs due to urinating some on floor. Educated on safety with ADLs.   Mobility  Overal bed mobility: Needs Assistance  Bed Mobility: Rolling;Sidelying to Sit  Rolling: Min assist  Sidelying to sit: Mod assist (and heavy use of the rail)  Sit to sidelying: Mod assist  General bed mobility comments: cuing for technique and truncal assist for roll and to sit EOB.   Transfers  Overall transfer level: Needs assistance  Equipment used: Rolling walker (2 wheeled);None  Transfers: Sit to/from Stand  Sit to Stand: Mod assist  Stand pivot transfers: Min assist;From elevated surface  General transfer comment: cuing for hand placement and safety. Pt having a disproportionately hard time coming forward an appropriate amount.   Ambulation / Gait / Stairs / Wheelchair Mobility  Ambulation/Gait  Ambulation/Gait assistance: Fish farm manager (Feet): 360 Feet  Assistive device: Rolling walker (2 wheeled)  Gait Pattern/deviations: Step-through pattern  Gait velocity: slow and guarded  Gait velocity interpretation: Below normal speed for age/gender  General Gait Details: short equal steps with flexed posture.   Posture / Balance  Dynamic Sitting Balance  Sitting balance - Comments: lists L with slightest challenge   Special needs/care consideration  BiPAP/CPAP yes has home CPAP  Bowel mgmt: continent but has had diarrhea since enema postop  Bladder mgmt:continent   Previous Home Environment  Living Arrangements: Spouse/significant other  Lives With: Spouse  Available Help at Discharge: Family;Available 24 hours/day  Type of Home: House  Home Layout: One level  Home Access: Stairs to enter  Entrance Stairs-Rails: None  Entrance Stairs-Number of Steps: 2   Bathroom Shower/Tub: Tourist information centre manager: Handicapped height  Bathroom Accessibility: Yes  How Accessible: Accessible via walker  [place]: No  Discharge Living Setting  Plans for Discharge Living Setting: Patient's home;Lives with (comment);Other (Comment) (spouse)  Type of Home at Discharge: House  Discharge Home Layout: One level  Discharge Home Access: Stairs to enter  Entrance Stairs-Rails: None  Entrance Stairs-Number of Steps: 2  Discharge Bathroom Shower/Tub: Walk-in shower  Discharge Bathroom Toilet: Handicapped height  Discharge Bathroom Accessibility: Yes  How Accessible: Accessible via walker  Does the patient have any problems obtaining your medications?: No  Social/Family/Support Systems  Patient Roles: Spouse;Parent  Contact Information: Napoleon Form, wife  Anticipated Caregiver: wife and son  Anticipated Caregiver's Contact Information: home [phone]; cell [phone]  Ability/Limitations of Caregiver: no limitations  Caregiver Availability: 24/7  Discharge Plan Discussed with Primary Caregiver: Yes  Is Caregiver In Agreement with Plan?: Yes  Does Caregiver/Family have Issues with Lodging/Transportation while Pt is in Rehab?: No (wife and son stay with pt in hospital 24/7)  Goals/Additional Needs  Patient/Family Goal for Rehab: supervsision with PT and OT  Expected length of stay: ELOS 10 to 14 days BUT pt states it will be much less  Special Service Needs: having diarrhea since enema postop  Pt/Family Agrees to Admission and willing to participate: Yes  Program Orientation Provided & Reviewed with Pt/Caregiver Including Roles & Responsibilities: Yes  Decrease burden of Care through IP rehab admission: n/a  Possible need for SNF placement upon discharge: n/a  Patient Condition: This patient's medical and functional status has changed since the consult dated: 01/09/14 in which the Rehabilitation Physician determined and documented that the  patient's condition is appropriate for intensive rehabilitative care in an inpatient rehabilitation facility. See "History of Present Illness" (above) for medical update. Functional changes are: overall min assist. Patient's medical and functional status update has been discussed with the Rehabilitation physician and patient remains appropriate for inpatient rehabilitation. Will admit to inpatient rehab today.  Preadmission Screen Completed By: Cleatrice Burke, 01/11/2014 12:11 PM  ______________________________________________________________________  Discussed status with Dr. Letta Pate on 01/11/14 at 1211 and received telephone approval for admission today.  Admission Coordinator: Cleatrice Burke, time [id number] Date 01/11/14.  Cosigned by: Charlett Blake, MD [01/11/2014 12:20 PM

## 2014-01-17 NOTE — Progress Notes (Signed)
Physical Therapy Session Note  Patient Details  Name: Kendle Erker MRN: [id number] Date of Birth: 1940/09/23  Today's Date: 01/17/2014 Time: 8127-5170 Time Calculation (min): 41 min  Short Term Goals: Week 1:  PT Short Term Goal 1 (Week 1): = LTGs  Skilled Therapeutic Interventions/Progress Updates:   Pt donned LSO EOB independently without requiring cues. Session focused on gait training on unit for endurance and strengthening overall S, simulated car transfer mod I with RW, and dynamic standing balance and tolerance activity to play Wii Bowling activity in standing. Pt with excellent participation and motivated to get home to his grandchildren.  Therapy Documentation Precautions:  Precautions Precautions: Back;Fall Precaution Comments: Pt able to recall 3/3 back precautions Required Braces or Orthoses: Spinal Brace Spinal Brace: Lumbar corset;Applied in sitting position Restrictions Weight Bearing Restrictions: No  Pain: Pain in low back - premedicated and improved with mobility.  See FIM for current functional status  Therapy/Group: Individual Therapy  Lars Masson 01/17/2014, 3:12 PM

## 2014-01-17 NOTE — Progress Notes (Signed)
[facility] PHYSICAL MEDICINE & REHABILITATION     PROGRESS NOTE    Subjective/Complaints: Making good functional progress. Had two loose stools yesterday however  A 12 point review of systems has been performed and if not noted above is otherwise negative.   Objective: Vital Signs: Blood pressure 147/69, pulse 80, temperature 97.4 F (36.3 C), temperature source Oral, resp. rate 18, weight 82 kg (180 lb 12.4 oz), SpO2 100.00%. No results found. No results found for this basename: WBC, HGB, HCT, PLT,  in the last 72 hours  Recent Labs  01/16/14 0608  NA 141  K 4.5  CL 104  GLUCOSE 113*  BUN 23  CREATININE 1.17  CALCIUM 10.5   CBG (last 3)  No results found for this basename: GLUCAP,  in the last 72 hours  Wt Readings from Last 3 Encounters:  01/17/14 82 kg (180 lb 12.4 oz)  01/05/14 91.354 kg (201 lb 6.4 oz)  01/05/14 91.354 kg (201 lb 6.4 oz)    Physical Exam:  HENT: tongue is dry Head: Normocephalic.  Eyes: EOM are normal.  Neck: Normal range of motion. Neck supple. No thyromegaly present.  Cardiovascular: Normal rate and regular rhythm.  Respiratory: Effort normal and breath sounds normal. No respiratory distress.  GI: Soft. Bowel sounds are normal. He exhibits no distension.  Neurological:  Patient is still a bit lethargic but arousable. He was able to provide his name and age as well as place. He follows simple commands. Needs extra time for orientation.   Bilateral deltoid, bicep, tricep, grip 4/5. LE , bilateral hip flexors 3 minus bilaterally knee extensors 4 minus, bilateral ankle dorsiflexors 4 minus  Sensory difficult to assess secondary to his slowed mental processing  Skin:  Back incision clean, intact wound well approximated without drainage Psychiatric:  Alert and appropriate  Musculoskeletal: minimal pain in LE's or UE with PROM or AROM today  Assessment/Plan: 1. Functional deficits secondary to Lumbar spondylolisthesis with radiculopathy/severe  polyarticular gout. Status post laminectomy, foraminotomies discectomy 01/06/2014  which require 3+ hours per day of interdisciplinary therapy in a comprehensive inpatient rehab setting. Physiatrist is providing close team supervision and 24 hour management of active medical problems listed below. Physiatrist and rehab team continue to assess barriers to discharge/monitor patient progress toward functional and medical goals.   FIM: FIM - Bathing Bathing Steps Patient Completed: Chest;Right Arm;Left Arm;Abdomen;Front perineal area;Right upper leg;Left upper leg;Buttocks;Right lower leg (including foot);Left lower leg (including foot) Bathing: 6: Assistive device (Comment)  FIM - Upper Body Dressing/Undressing Upper body dressing/undressing steps patient completed: Thread/unthread right sleeve of pullover shirt/dresss;Pull shirt over trunk;Thread/unthread left sleeve of pullover shirt/dress;Put head through opening of pull over shirt/dress Upper body dressing/undressing: 5: Supervision: Safety issues/verbal cues FIM - Lower Body Dressing/Undressing Lower body dressing/undressing steps patient completed: Pull underwear up/down;Fasten/unfasten pants;Pull pants up/down;Thread/unthread right underwear leg;Thread/unthread left underwear leg;Thread/unthread right pants leg;Thread/unthread left pants leg;Fasten/unfasten right shoe Lower body dressing/undressing: 4: Min-Patient completed 75 plus % of tasks  FIM - Toileting Toileting steps completed by patient: Adjust clothing prior to toileting;Performs perineal hygiene;Adjust clothing after toileting Toileting: 0: Activity did not occur  FIM - Air cabin crew Transfers Assistive Devices: Elevated toilet seat;Grab bars;Walker Personnel officer: 0-Activity did not occur  FIM - Control and instrumentation engineer Devices: Chiropractor: 6: Supine > Sit: No assist;6: Sit > Supine: No assist;5: Bed > Chair or W/C:  Supervision (verbal cues/safety issues);5: Chair or W/C > Bed: Supervision (verbal cues/safety issues)  FIM - Locomotion:  Wheelchair Locomotion: Wheelchair: 0: Activity did not occur FIM - Locomotion: Ambulation Locomotion: Ambulation Assistive Devices: Orthosis Ambulation/Gait Assistance: 4: Min assist Locomotion: Ambulation: 2: Travels 50 - 149 ft with minimal assistance (Pt.>75%) (S with RW)  Comprehension Comprehension Mode: Auditory Comprehension: 6-Follows complex conversation/direction: With extra time/assistive device  Expression Expression Mode: Verbal Expression: 5-Expresses complex 90% of the time/cues < 10% of the time  Social Interaction Social Interaction: 5-Interacts appropriately 90% of the time - Needs monitoring or encouragement for participation or interaction.  Problem Solving Problem Solving: 5-Solves complex 90% of the time/cues < 10% of the time  Memory Memory: 5-Recognizes or recalls 90% of the time/requires cueing < 10% of the time  Medical Problem List and Plan:  1. 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 Status post laminectomy, foraminotomies discectomy 01/06/2014  2. DVT Prophylaxis/Anticoagulation: Subcutaneous heparin.    3. Pain Management: Robaxin and hydrocodone as needed.  4. Neuropsych: This patient is capable of making decisions on his own behalf.  5. Gout. Colchicine 0.6 mg 3 times a day held for the time being due to diarrhea.   -continue Uloric 40 mg daily as needed gout flareup.  6. BPH. Flomax 0.4 mg daily.    7. Hypertension. Norvasc 5 mg daily. Blood pressure is under good control and monitored  8. Obstructive sleep apnea. CPAP  9. Diarrhea: appeared to be resolving---but two loose stools yesterday  -check stool for c diff today (odor present)   -Prn imodium  -probiotic  -encourage fluids --bmet normal today  -avoid colchicine for now LOS (Days) 6 A FACE TO FACE EVALUATION WAS PERFORMED  Meredith Staggers 01/17/2014 8:16 AM

## 2014-01-17 NOTE — Progress Notes (Signed)
Occupational Therapy Session Notes  Patient Details  Name: [name] MRN: [id number] Date of Birth: [birth year]  Today's Date: 01/17/2014  Short Term Goals: Week 1:  OT Short Term Goal 1 (Week 1): Short Term Goals = Long Term Goals  Skilled Therapeutic Interventions/Progress Updates:   Session #1 [phone] - 61 Minutes Individual Therapy No complaints of pain Patient received supine in bed. Patient engaged in bed mobility, donned lumbar corset with set-up assist. Patient stood with RW and ambulated around room to gather clothing for ADL, patient mod I with functional mobility using RW. Patient ambulated into bathroom for shower stall transfer and ADL retraining at shower level. Patient required min verbal cues for safety/adherence to lumbar corset wearing orders. UB/LB bathing completed in seated position secondary to lumbar corset wearing orders. UB/LB dressing completed in shower, patient donning shirt and lumbar corset prior to standing. Patient ambulated > w/c after dressing and sat in w/c for donning of socks and shoes, patient used sock aid and reacher to increase independence with these tasks; patient needs practice with this and therapist encouraged him to practice in-between therapies. At end of session, left patient seated in w/c with all needs within reach.   Session #2 1130-1200 - 30 Minutes Individual Therapy No complaints of pain Upon entering room, patient found supine in bed using urinal with wife present. Patient engaged in bed mobility and sat EOB at mod I level, patient donned lumbar corset with set-up assistance. Patient then ambulated > ADL apartment using RW. Patient performed furniture transfer at mod I level. Patient then performed simulated walk-in shower transfer using RW and shower seat. Patient's wife present and therapist educated her on shower stall transfers and technique. Patient then ambulated back to room at mod I level and sat in w/c. Patient attempted to  doff lumbar corset, therapist had to provide min verbal cues to keep corset on. At end of session, left patient seated in w/c with all needs within reach and wife present. Wife purchased hip kit for patient to use at home.   Precautions:  Precautions Precautions: Back;Fall Precaution Comments: Pt able to recall 3/3 back precautions Required Braces or Orthoses: Spinal Brace Spinal Brace: Lumbar corset;Applied in sitting position Restrictions Weight Bearing Restrictions: No  See FIM for current functional status  [name] 01/17/2014, 7:23 AM

## 2014-01-17 NOTE — Progress Notes (Signed)
Social Work  Discharge Note  The overall goal for the admission was met for:   Discharge location: Yes - home with wife to provide any needed assist  Length of Stay: Yes - 8 days  Discharge activity level: Yes - modified independent  Home/community participation: Yes  Services provided included: MD, RD, PT, OT, RN, Pharmacy, Neuropsych and SW  Financial Services: Medicare  Follow-up services arranged: Outpatient: PT RECOMMENDED, HOWEVER, PT HAS DECLINED - NO SERVICES ARRANGED, DME: tub seat via [place] and Patient/Family has no preference for HH/DME agencies  Comments (or additional information):  Patient/Family verbalized understanding of follow-up arrangements: Yes  Individual responsible for coordination of the follow-up plan: patient  Confirmed correct DME delivered: Lennart Pall 01/17/2014    Lennart Pall

## 2014-01-18 ENCOUNTER — Inpatient Hospital Stay (HOSPITAL_COMMUNITY): Payer: Medicare Other

## 2014-01-18 ENCOUNTER — Inpatient Hospital Stay (HOSPITAL_COMMUNITY): Payer: Medicare Other | Admitting: Occupational Therapy

## 2014-01-18 ENCOUNTER — Encounter (HOSPITAL_COMMUNITY): Payer: Medicare Other | Admitting: Occupational Therapy

## 2014-01-18 NOTE — Progress Notes (Signed)
PHYSICAL MEDICINE & REHABILITATION     PROGRESS NOTE    Subjective/Complaints: NO bm's yesterday. Feels good. Happy with progress. Thinks he may have to have a bm this am.  A 12 point review of systems has been performed and if not noted above is otherwise negative.   Objective: Vital Signs: Blood pressure 123/65, pulse 78, temperature 98 F (36.7 C), temperature source Oral, resp. rate 19, weight 82 kg (180 lb 12.4 oz), SpO2 100.00%. No results found. No results found for this basename: WBC, HGB, HCT, PLT,  in the last 72 hours  Recent Labs  01/16/14 0608  NA 141  K 4.5  CL 104  GLUCOSE 113*  BUN 23  CREATININE 1.17  CALCIUM 10.5   CBG (last 3)  No results found for this basename: GLUCAP,  in the last 72 hours  Wt Readings from Last 3 Encounters:  01/17/14 82 kg (180 lb 12.4 oz)  01/05/14 91.354 kg (201 lb 6.4 oz)  01/05/14 91.354 kg (201 lb 6.4 oz)    Physical Exam:  HENT: tongue is dry Head: Normocephalic.  Eyes: EOM are normal.  Neck: Normal range of motion. Neck supple. No thyromegaly present.  Cardiovascular: Normal rate and regular rhythm.  Respiratory: Effort normal and breath sounds normal. No respiratory distress.  GI: Soft. Bowel sounds are normal. He exhibits no distension.  Neurological:  Patient is alert and appropriate. Good insight and awareness.   Bilateral deltoid, bicep, tricep, grip 4/5. LE , bilateral hip flexors 3 minus bilaterally knee extensors 4 minus, bilateral ankle dorsiflexors 4 minus  Sensory difficult to assess secondary to his slowed mental processing  Skin:  Back incision clean, intact wound well approximated without drainage Psychiatric:  Alert and appropriate  Musculoskeletal: minimal pain in LE's or UE with PROM or AROM today  Assessment/Plan: 1. Functional deficits secondary to Lumbar spondylolisthesis with radiculopathy/severe polyarticular gout. Status post laminectomy, foraminotomies discectomy 01/06/2014   which require 3+ hours per day of interdisciplinary therapy in a comprehensive inpatient rehab setting. Physiatrist is providing close team supervision and 24 hour management of active medical problems listed below. Physiatrist and rehab team continue to assess barriers to discharge/monitor patient progress toward functional and medical goals.   FIM: FIM - Bathing Bathing Steps Patient Completed: Chest;Right Arm;Left Arm;Abdomen;Front perineal area;Right upper leg;Left upper leg;Buttocks;Right lower leg (including foot);Left lower leg (including foot) Bathing: 6: Assistive device (Comment)  FIM - Upper Body Dressing/Undressing Upper body dressing/undressing steps patient completed: Thread/unthread right sleeve of pullover shirt/dresss;Pull shirt over trunk;Thread/unthread left sleeve of pullover shirt/dress;Put head through opening of pull over shirt/dress Upper body dressing/undressing: 5: Supervision: Safety issues/verbal cues FIM - Lower Body Dressing/Undressing Lower body dressing/undressing steps patient completed: Pull underwear up/down;Fasten/unfasten pants;Pull pants up/down;Thread/unthread right underwear leg;Thread/unthread left underwear leg;Thread/unthread right pants leg;Thread/unthread left pants leg Lower body dressing/undressing: 4: Min-Patient completed 75 plus % of tasks  FIM - Toileting Toileting steps completed by patient: Adjust clothing prior to toileting;Performs perineal hygiene;Adjust clothing after toileting Toileting: 0: Activity did not occur  FIM - Air cabin crew Transfers Assistive Devices: Elevated toilet seat;Grab bars;Walker Personnel officer: 0-Activity did not occur  FIM - Control and instrumentation engineer Devices: Walker;Orthosis Bed/Chair Transfer: 6: Sit > Supine: No assist;5: Chair or W/C > Bed: Supervision (verbal cues/safety issues)  FIM - Locomotion: Wheelchair Locomotion: Wheelchair: 0: Activity did not occur FIM -  Locomotion: Ambulation Locomotion: Ambulation Assistive Devices: Orthosis;Walker - Rolling Ambulation/Gait Assistance: 5: Supervision Locomotion: Ambulation: 5: Travels 150 ft  or more with supervision/safety issues  Comprehension Comprehension Mode: Auditory Comprehension: 6-Follows complex conversation/direction: With extra time/assistive device  Expression Expression Mode: Verbal Expression: 5-Expresses complex 90% of the time/cues < 10% of the time  Social Interaction Social Interaction: 6-Interacts appropriately with others with medication or extra time (anti-anxiety, antidepressant).  Problem Solving Problem Solving: 5-Solves complex 90% of the time/cues < 10% of the time  Memory Memory: 5-Recognizes or recalls 90% of the time/requires cueing < 10% of the time  Medical Problem List and Plan:  1. 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 Status post laminectomy, foraminotomies discectomy 01/06/2014  2. DVT Prophylaxis/Anticoagulation: Subcutaneous heparin.    3. Pain Management: Robaxin and hydrocodone as needed.  4. Neuropsych: This patient is capable of making decisions on his own behalf.  5. Gout. Colchicine 0.6 mg 3 times a day held for the time being due to diarrhea.   -continue Uloric 40 mg daily as needed gout flareup.  6. BPH. Flomax 0.4 mg daily.    7. Hypertension. Norvasc 5 mg daily. Blood pressure is under good control and monitored  8. Obstructive sleep apnea. CPAP  9. Diarrhea: appeared to be resolving---no bm's yesterday  -check stool for c diff today if he has a loose stool  -Prn imodium  -probiotic  -encourage fluids --bmet normal    -avoid colchicine for now LOS (Days) 7 A FACE TO FACE EVALUATION WAS PERFORMED  Meredith Staggers 01/18/2014 8:15 AM

## 2014-01-18 NOTE — Progress Notes (Signed)
Physical Therapy Discharge Summary  Patient Details  Name: [name] MRN: [id number] Date of Birth: [birth year]  Today's Date: 01/18/2014  Patient has met 6 of 6 long term goals due to improved activity tolerance, improved balance, improved postural control, increased strength, decreased pain, ability to compensate for deficits and improved awareness.  Patient to discharge at an ambulatory level Modified Independent using RW.   Patient's wife  is independent to provide the necessary set-up/intermittent supervision assistance at discharge.  Reasons goals not met: n/a - all goals met at this time.  Recommendation:  Recommended OPPT follow up for continued skilled intervention for high level balance, gait, community mobility and endurance but pt declines. Educated on importance of maintaining HEP and daily walking routine to which pt verbalized understanding.  Equipment: No equipment provided. Pt already owns RW.  Reasons for discharge: treatment goals met and discharge from hospital  Patient/family agrees with progress made and goals achieved: Yes  PT Discharge Precautions/Restrictions Precautions Precautions: Back;Fall Precaution Booklet Issued: No Precaution Comments: Pt able to recall and adhere to 3/3 back precautions Required Braces or Orthoses: Spinal Brace Spinal Brace: Lumbar corset;Applied in sitting position Restrictions Weight Bearing Restrictions: No  Cognition Overall Cognitive Status: Within Functional Limits for tasks assessed Arousal/Alertness: Awake/alert Orientation Level: Oriented X4 Memory: Appears intact Awareness: Appears intact Problem Solving: Appears intact Safety/Judgment: Appears intact Sensation Sensation Light Touch: Appears Intact Proprioception: Appears Intact Additional Comments: BUE appear intact Coordination Gross Motor Movements are Fluid and Coordinated: Yes Fine Motor Movements are Fluid and Coordinated: Yes Motor  Motor Motor:  Within Functional Limits  Locomotion  Ambulation Ambulation/Gait Assistance: 6: Modified independent (Device/Increase time) with RW  Trunk/Postural Assessment  Cervical Assessment Cervical Assessment: Within Functional Limits Thoracic Assessment Thoracic Assessment: Exceptions to [facility] (same as admission) Lumbar Assessment Lumbar Assessment: Exceptions to [facility] (same as admission) Postural Control Postural Control: Within Functional Limits  Balance Balance Balance Assessed: Yes Static Sitting Balance Static Sitting - Level of Assistance: 7: Independent Dynamic Sitting Balance Dynamic Sitting - Level of Assistance: 6: Modified independent (Device/Increase time) Static Standing Balance Static Standing - Level of Assistance: 6: Modified independent (Device/Increase time) (with RW) Dynamic Standing Balance Dynamic Standing - Level of Assistance: 6: Modified independent (Device/Increase time) (with RW) Extremity Assessment  RUE Assessment RUE Assessment: Within Functional Limits LUE Assessment LUE Assessment: Within Functional Limits RLE Assessment RLE Assessment: Within Functional Limits (grossly 4+/5) LLE Assessment LLE Assessment: Within Functional Limits (grossly 4+/5)  See FIM for current functional status  Lars Masson 01/18/2014, 12:00 PM

## 2014-01-18 NOTE — Discharge Summary (Signed)
Discharge summary job # [id number]

## 2014-01-18 NOTE — Progress Notes (Signed)
Physical Therapy Session Note  Patient Details  Name: [name] MRN: [id number] Date of Birth: [birth year]  Today's Date: 01/18/2014  Short Term Goals: Week 1:  PT Short Term Goal 1 (Week 1): = LTGs  Session #1: Time: [id number] (56 min) Individual therapy; No complaints of pain. Pt finished donning Ted hose and shoes independently with adaptive equipment as well as functional mobility in the room with RW to pick up clothing from OT session earlier (independently using reacher to pick up items). Gait on unit and in home environment overall mod I. Stairs with bilateral rails mod I for community mobility training and home entry. Bed mobility in ADL apartment to review proper technique and pt mod I with log roll technique. Reviewed [place] HEP and discussed importance of continued HEP and walking daily as pt declining OPPT services at this time - pt in agreement and understanding. Curb step negotiation for community mobility training with RW and initially cues for technique but progressed to mod I. Nustep for general strengthening and endurance x 10 min level 4. Pt made Mod I in room with RW  Session #2: Time: 1030-1100 (30 min) Individual therapy; Denies pain. Session focused on functional endurance and dynamic standing balance during Wii Bowling game - overall mod I level with RW. Pt gait to/from therapy mod I with RW. Pt denies any further questions prior to d/c.  Therapy Documentation Precautions:  Precautions Precautions: Back;Fall Precaution Comments: Pt able to recall and adhere to 3/3 back precautions Required Braces or Orthoses: Spinal Brace Spinal Brace: Lumbar corset;Applied in sitting position Restrictions Weight Bearing Restrictions: No   See FIM for current functional status  Therapy/Group: Individual Therapy  Lars Masson 01/18/2014, 8:23 AM

## 2014-01-18 NOTE — Discharge Summary (Signed)
[name], [name]               ACCOUNT NO.:  [id number]  MEDICAL RECORD NO.:  [id number]  LOCATION:  4W22C                        FACILITY:  [facility]  PHYSICIAN:  Meredith Staggers, M.D.DATE OF BIRTH:  10-07-1940  DATE OF ADMISSION:  01/11/2014 DATE OF DISCHARGE:  01/19/2014                              DISCHARGE SUMMARY   DISCHARGE DIAGNOSES: 1. Lumbar spondylolisthesis with radiculopathy with severe     polyarticular gout status post laminectomy diskectomy. 2. Subcutaneous heparin for deep venous thrombosis prophylaxis. 3. Pain management. 4. Gout. 5. Benign prostatic hypertrophy. 6. Hypertension. 7. Obstructive sleep apnea with continuous positive airway pressure. 8. Diarrhea, resolved.  HISTORY OF PRESENT ILLNESS:  This is a 73 year old right-handed male, history of hypertension and gout, who was independent prior to admission, living with his wife.  Admitted on January 04, 2014, with progressive low back pain that has progressed over the last 2 years.  No relief with conservative care.  X-rays and imaging revealed lumbar L4-5 spondylolisthesis with stenosis, lumbar L3-4 chronic radiculopathy. Underwent bilateral L3 laminectomy, foraminotomy, bilateral L4 diskectomy, posterolateral arthrodesis on January 06, 2014, per Dr. Joya Salm.  Postoperative pain management.  Back brace when out of bed. Subcutaneous heparin for DVT prophylaxis.  Physical and occupational therapy ongoing.  The patient was admitted for comprehensive rehab program.  PAST MEDICAL HISTORY:  See discharge diagnoses.  SOCIAL HISTORY:  Lives with spouse.  FUNCTIONAL HISTORY PRIOR TO ADMISSION:  Independent without assistive device.  Functional status upon admission to [place] was moderate assist for sit to side-lying, minimal assist for stand pivot transfers, ambulating, minimal assist with the rolling walker.  PHYSICAL EXAMINATION:  VITAL SIGNS:  Blood pressure 120/67, pulse 74, temperature 97.6,  respirations 14. GENERAL:  This was a lethargic male, but arousable.  He was able to provide his name and age.  He followed simple commands.  Limited medical historian.  Pupils round and reactive to light. LUNGS:  Clear to auscultation. CARDIAC:  Regular rate and rhythm. ABDOMEN:  Soft, nontender.  Good bowel sounds.  Back incision without evidence of drainage.  Sutures intact.  REHABILITATION HOSPITAL COURSE:  The patient was admitted to Inpatient Rehab Services with therapies initiated on a 3-hour daily basis consisting of physical therapy, occupational therapy, and rehabilitation nursing.  The following issues were addressed during the patient's rehabilitation stay.  Pertaining to Mr. [name]'s lumbar stenosis with radiculopathy, he had undergone laminectomy foraminotomy diskectomy on January 06, 2014, per Dr. Joya Salm. Monitor the surgical site with sutures remaining in place followup neurosurgery one week.  Back brace when out of bed.  Subcutaneous heparin for DVT prophylaxis with no signs of DVT.  Pain management with the use of Robaxin and hydrocodone as well as the addition of a Duragesic patch for pain.  He did have a history of gout.  He was maintained on colchicine for gout flare-up as well as ongoing Uloric as needed.  He was voiding without difficulty with history of benign prostatic hypertrophy.  No dysuria or hematuria.  He continued on Flomax.  Blood pressures controlled on Norvasc.  He would follow up with his primary MD.  He continue with his CPAP for obstructive sleep apnea.  Occasional  bouts of diarrhea.  C. diff negative colchicine avoided due to diarrhea.  The patient received weekly collaborative interdisciplinary team conferences to discuss estimated length of stay, family teaching, and any barriers to discharge.  He was overall ambulating with a rolling walker and supervision.  Dynamic standing balance and balance reaction training on a compliant surface.  He was able  to maintain his back precautions. Stair negotiations with  supervision.  He was able to don and doff his brace at bedside with setup for assistance.  He ambulates to the bathroom for shower, stall transfers, and activities of daily living. Ambulates wheelchair after dressing, able to sit for donning and doffing his socks and shoes.  Full family teaching completed.  The patient was discharged to home with ongoing therapies dictated per [facility] but he adamantly declined any further therapy.  DISCHARGE MEDICATIONS:  Included Norvasc 5 mg p.o. daily, Uloric 40 mg p.o. daily as needed, fentanyl patch 25 mcg change every 72 hours, hydrocodone 2 tablets every 4 hours as needed for moderate pain, dispense of 90 tablets, Robaxin 500 mg p.o. every 6 hours as needed muscle spasms, Florastor 250 mg p.o. b.i.d., Flomax 0.4 mg p.o. daily.  DIET:  Regular.  He would follow up with Dr. Alger Simons, the Outpatient Rehab Service Office as needed.  Dr. Leeroy Cha, Neurosurgery 2 weeks call for appointment.  Dr. Rubie Maid, medical management appointment to be made.  SPECIAL INSTRUCTIONS:  Back brace when out of bed.     Lauraine Rinne, P.A.   ______________________________ Meredith Staggers, M.D.    DA/MEDQ  D:  01/18/2014  T:  01/18/2014  Job:  [id number]  cc:   Leeroy Cha, M.D. Rubie Maid, MD

## 2014-01-18 NOTE — Progress Notes (Signed)
Occupational Therapy Session Notes & Discharge Summary  Patient Details  Name: [name] MRN: [id number] Date of Birth: [birth year]  Today's Date: 01/18/2014  SESSION NOTES  Session #1 [phone] - 33 Minutes Individual Therapy No complaints of pain Patient received supine in bed trying to eat breakfast, therapist encouraged patient to sit up for breakfast. Patient sat EOB at mod I level, donned brace and ate breakfast. Therapist discussed d/c planning with patient and patient eager to go home, "I'm going to be real nice today". Patient ambulated around room with RW at mod I level in order to gather all necessary items for ADL. Patient then performed shower stall transfer and UB/LB bathing at mod I level, in seated position secondary to lumbar corset wearing orders. Patient completed LB dressing from w/c level using AE prn & appropriately. Patient able to verbalize and adhere to 3/3 back precautions and patient overall mod I for BADL tasks. At end of session, left patient seated in w/c with all needs within reach and PT present for next session.   Session #2 [id number] - 45 Minutes Individual Therapy No complaints of pain Patient received supine in bed. Patient sat EOB, donned brace, donned shoes, then stood with RW and ambulated > ADL apartment (all at mod I level). Patient performed simple meal prep with supervision from therapist. Patient ambulated back to room at mod I level. Patient made mod I within room. Patient happy with progress made and eager to discharge > home tomorrow.   -------------------------------------------------------------------------------------------------------------------------------------   DISCHARGE SUMMARY Patient has met 12 of 12 long term goals due to improved activity tolerance, improved balance, postural control, ability to compensate for deficits, improved attention, improved awareness and improved coordination.  Patient to discharge at overall Modified  Independent level.  Patient's care partner is independent to provide the necessary supervision assistance prn at discharge.    Reasons goals not met: n/a, all goals met at this time.  Recommendation: No additional occupational therapy recommended at this time. Patient overall mod I for BADL tasks. Patient's wife will be home to provide assistance with IADLs as needed.   Equipment: shower seat, patient and wife state they have a [place] for use at home.   Reasons for discharge: treatment goals met and discharge from hospital  Patient/family agrees with progress made and goals achieved: Yes  Precautions/Restrictions  Precautions Precautions: Back;Fall Precaution Comments: Pt able to recall and adhere to 3/3 back precautions Required Braces or Orthoses: Spinal Brace Spinal Brace: Lumbar corset;Applied in sitting position Restrictions Weight Bearing Restrictions: No  Vital Signs Therapy Vitals Temp: 98 F (36.7 C) Temp src: Oral Pulse Rate: 78 Resp: 19 BP: 123/65 mmHg Patient Position, if appropriate: Lying Oxygen Therapy SpO2: 100 %  ADL - See FIM for more information and more details ADL Equipment Provided: Reacher;Sock aid;Long-handled shoe horn;Long-handled sponge  Vision/Perception  Vision- History Baseline Vision/History: Wears glasses Wears Glasses: Reading only Patient Visual Report: No change from baseline Vision- Assessment Vision Assessment?: No apparent visual deficits   Cognition Overall Cognitive Status: Within Functional Limits for tasks assessed Arousal/Alertness: Awake/alert Orientation Level: Oriented X4 Memory: Appears intact Awareness: Appears intact Problem Solving: Appears intact Safety/Judgment: Appears intact  Sensation Sensation Light Touch: Appears Intact Additional Comments: BUE appear intact Coordination Gross Motor Movements are Fluid and Coordinated: Yes Fine Motor Movements are Fluid and Coordinated: Yes  Motor  Motor Motor:  Within Functional Limits  Trunk/Postural Assessment  Cervical Assessment Cervical Assessment: Within Functional Limits Thoracic Assessment Thoracic Assessment: Exceptions to [facility] (  same as admission) Lumbar Assessment Lumbar Assessment: Exceptions to [facility] (same as admission) Postural Control Postural Control: Within Functional Limits   Balance Balance Balance Assessed: Yes Static Sitting Balance Static Sitting - Level of Assistance: 7: Independent Dynamic Sitting Balance Dynamic Sitting - Level of Assistance: 6: Modified independent (Device/Increase time) Static Standing Balance Static Standing - Level of Assistance: 6: Modified independent (Device/Increase time) (with RW) Dynamic Standing Balance Dynamic Standing - Level of Assistance: 6: Modified independent (Device/Increase time) (with RW)  Extremity/Trunk Assessment RUE Assessment RUE Assessment: Within Functional Limits LUE Assessment LUE Assessment: Within Functional Limits  See FIM for current functional status  [name] 01/18/2014, 9:34 AM

## 2014-01-19 DIAGNOSIS — IMO0002 Reserved for concepts with insufficient information to code with codable children: Secondary | ICD-10-CM

## 2014-01-19 MED ORDER — TAMSULOSIN HCL 0.4 MG PO CAPS: 0.4000 mg | ORAL_CAPSULE | Freq: Every day | ORAL | Status: AC

## 2014-01-19 MED ORDER — SACCHAROMYCES BOULARDII 250 MG PO CAPS: 250.0000 mg | ORAL_CAPSULE | Freq: Two times a day (BID) | ORAL | Status: AC

## 2014-01-19 MED ORDER — METHOCARBAMOL 500 MG PO TABS: 500.0000 mg | ORAL_TABLET | Freq: Four times a day (QID) | ORAL | Status: AC | PRN

## 2014-01-19 MED ORDER — FENTANYL 25 MCG/HR TD PT72: 25.0000 ug | MEDICATED_PATCH | TRANSDERMAL | Status: AC

## 2014-01-19 MED ORDER — AMLODIPINE BESYLATE 5 MG PO TABS: 5.0000 mg | ORAL_TABLET | Freq: Every day | ORAL | Status: AC

## 2014-01-19 MED ORDER — HYDROCODONE-ACETAMINOPHEN 10-325 MG PO TABS: 2.0000 | ORAL_TABLET | ORAL | Status: AC | PRN

## 2014-01-19 MED ORDER — FEBUXOSTAT 40 MG PO TABS: 40.0000 mg | ORAL_TABLET | Freq: Every day | ORAL | Status: AC

## 2014-01-19 NOTE — Progress Notes (Signed)
Patient and family received discharge instructions from Dan Angiulli, PA-C with verbal understanding. Patient discharged to home with family and belongings. 

## 2014-01-19 NOTE — Progress Notes (Signed)
[place] PHYSICAL MEDICINE & REHABILITATION     PROGRESS NOTE    Subjective/Complaints: Feeling well. Excited to go home.   A 12 point review of systems has been performed and if not noted above is otherwise negative.   Objective: Vital Signs: Blood pressure 107/62, pulse 93, temperature 98 F (36.7 C), temperature source Oral, resp. rate 20, weight 82 kg (180 lb 12.4 oz), SpO2 100.00%. No results found. No results found for this basename: WBC, HGB, HCT, PLT,  in the last 72 hours No results found for this basename: NA, K, CL, CO, GLUCOSE, BUN, CREATININE, CALCIUM,  in the last 72 hours CBG (last 3)  No results found for this basename: GLUCAP,  in the last 72 hours  Wt Readings from Last 3 Encounters:  01/17/14 82 kg (180 lb 12.4 oz)  01/05/14 91.354 kg (201 lb 6.4 oz)  01/05/14 91.354 kg (201 lb 6.4 oz)    Physical Exam:  HENT: tongue is dry Head: Normocephalic.  Eyes: EOM are normal.  Neck: Normal range of motion. Neck supple. No thyromegaly present.  Cardiovascular: Normal rate and regular rhythm.  Respiratory: Effort normal and breath sounds normal. No respiratory distress.  GI: Soft. Bowel sounds are normal. He exhibits no distension.  Neurological:  Patient is alert and appropriate. Good insight and awareness.   Bilateral deltoid, bicep, tricep, grip 4/5. LE , bilateral hip flexors 3 minus bilaterally knee extensors 4 minus, bilateral ankle dorsiflexors 4 minus  Sensory difficult to assess secondary to his slowed mental processing  Skin:  Back incision clean, intact wound well approximated without drainage---dried fibronecrotic debris at bottom of wound Psychiatric:  Alert and appropriate  Musculoskeletal: minimal pain in LE's or UE with PROM or AROM today  Assessment/Plan: 1. Functional deficits secondary to Lumbar spondylolisthesis with radiculopathy/severe polyarticular gout. Status post laminectomy, foraminotomies discectomy 01/06/2014  which require 3+ hours  per day of interdisciplinary therapy in a comprehensive inpatient rehab setting. Physiatrist is providing close team supervision and 24 hour management of active medical problems listed below. Physiatrist and rehab team continue to assess barriers to discharge/monitor patient progress toward functional and medical goals.   FIM: FIM - Bathing Bathing Steps Patient Completed: Chest;Right Arm;Left Arm;Abdomen;Front perineal area;Right upper leg;Left upper leg;Buttocks;Right lower leg (including foot);Left lower leg (including foot) Bathing: 6: Assistive device (Comment)  FIM - Upper Body Dressing/Undressing Upper body dressing/undressing steps patient completed: Thread/unthread right sleeve of pullover shirt/dresss;Pull shirt over trunk;Thread/unthread left sleeve of pullover shirt/dress;Put head through opening of pull over shirt/dress Upper body dressing/undressing: 7: Complete Independence: No helper (including donning of lumbar corset) FIM - Lower Body Dressing/Undressing Lower body dressing/undressing steps patient completed: Pull underwear up/down;Fasten/unfasten pants;Pull pants up/down;Thread/unthread right underwear leg;Thread/unthread left underwear leg;Thread/unthread right pants leg;Thread/unthread left pants leg;Don/Doff right sock;Don/Doff left sock;Don/Doff right shoe;Don/Doff left shoe Lower body dressing/undressing: 6: Assistive device (Comment)  FIM - Toileting Toileting steps completed by patient: Adjust clothing prior to toileting;Performs perineal hygiene;Adjust clothing after toileting Toileting: 6: Assistive device: No helper  FIM - Radio producer Devices: Elevated toilet seat;Grab bars;Walker Toilet Transfers: 6-Assistive device: No helper  FIM - Control and instrumentation engineer Devices: Walker;Orthosis;Arm rests Bed/Chair Transfer: 6: Assistive device: no helper  FIM - Locomotion: Wheelchair Locomotion: Wheelchair: 0:  Activity did not occur (gait primary means) FIM - Locomotion: Ambulation Locomotion: Ambulation Assistive Devices: Orthosis;Walker - Rolling Ambulation/Gait Assistance: 6: Modified independent (Device/Increase time) Locomotion: Ambulation: 6: Travels 150 ft or more with assistive device/no helper  Comprehension  Comprehension Mode: Auditory Comprehension: 6-Follows complex conversation/direction: With extra time/assistive device  Expression Expression Mode: Verbal Expression: 6-Expresses complex ideas: With extra time/assistive device  Social Interaction Social Interaction: 6-Interacts appropriately with others with medication or extra time (anti-anxiety, antidepressant).  Problem Solving Problem Solving: 6-Solves complex problems: With extra time  Memory Memory: 6-More than reasonable amt of time  Medical Problem List and Plan:  1. 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 Status post laminectomy, foraminotomies discectomy 01/06/2014  2. DVT Prophylaxis/Anticoagulation: Subcutaneous heparin.    3. Pain Management: Robaxin and hydrocodone as needed.  4. Neuropsych: This patient is capable of making decisions on his own behalf.  5. Gout. Colchicine 0.6 mg--can resume prn at home for flare   -continue Uloric 40 mg daily as needed gout flareup.  6. BPH. Flomax 0.4 mg daily.    7. Hypertension. Norvasc 5 mg daily. Blood pressure is under good control and monitored  8. Obstructive sleep apnea. CPAP  9. Diarrhea: appeared to be resolving---no bm's yesterday  -Prn imodium  -probiotic  -encourage fluids --bmet normal   LOS (Days) 8 A FACE TO FACE EVALUATION WAS PERFORMED  Meredith Staggers 01/19/2014 9:22 AM

## 2014-01-19 NOTE — Discharge Instructions (Signed)
Inpatient Rehab Discharge Instructions  [name] Discharge date and time: No discharge date for patient encounter.   Activities/Precautions/ Functional Status: Activity: activity as tolerated Diet: regular diet Wound Care: keep wound clean and dry Functional status:  ___ No restrictions     ___ Walk up steps independently ___ 24/7 supervision/assistance   ___ Walk up steps with assistance ___ Intermittent supervision/assistance  ___ Bathe/dress independently ___ Walk with walker     ___ Bathe/dress with assistance ___ Walk Independently    ___ Shower independently __x_ Walk with assistance    ___ Shower with assistance ___ No alcohol     ___ Return to work/school ________   Tub seat ordered via [place] Instructions:  Back brace when out of bed   Followup with Dr. Joya Salm one week for removal of staples   COMMUNITY REFERRALS UPON DISCHARGE:   [name] DECLINED FOLLOW UP OUTPATIENT THERAPY  Medical Equipment/Items Ordered:TUB SEAT  Agency/Supplier:[employer]   [phone]  My questions have been answered and I understand these instructions. I will adhere to these goals and the provided educational materials after my discharge from the hospital.  Patient/Caregiver Signature _______________________________ Date __________  Clinician Signature _______________________________________ Date __________  Please bring this form and your medication list with you to all your follow-up doctor's appointments.

## 2014-11-18 IMAGING — CT CT L SPINE W/ CM
4 of 11 series · 12 of 33 positions shown, 14 images · non-contrast
Comparison: MRI lumbar spine 08/01/2012 [REDACTED].

CLINICAL DATA: Right subtle low back pain extending into the
lateral thigh.

EXAM:
LUMBAR MYELOGRAM
TECHNIQUE: Contiguous axial images were obtained through the Lumbar spine after
the intrathecal infusion of infusion. Coronal and sagittal
reconstructions were obtained of the axial image sets.

[Series 2: l spine bone · axial · 0.27mm/px · z∈[-242,-154]mm · 2 of 106 slices shown, 3 images]
[im 36/106  soft-tissue]
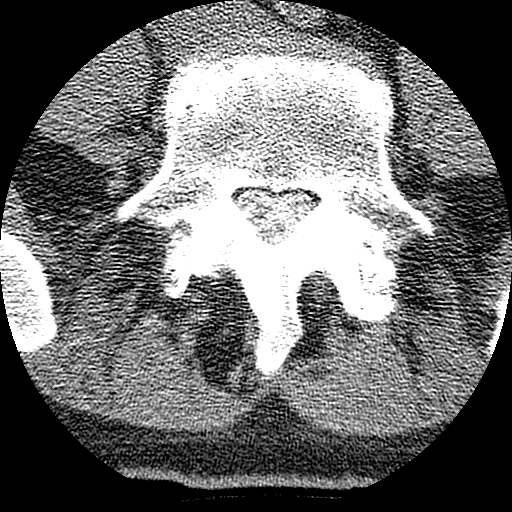
[im 36/106  bone]
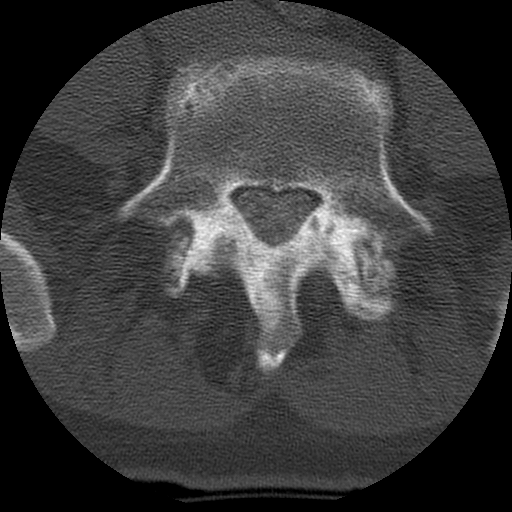
[im 71/106  bone]
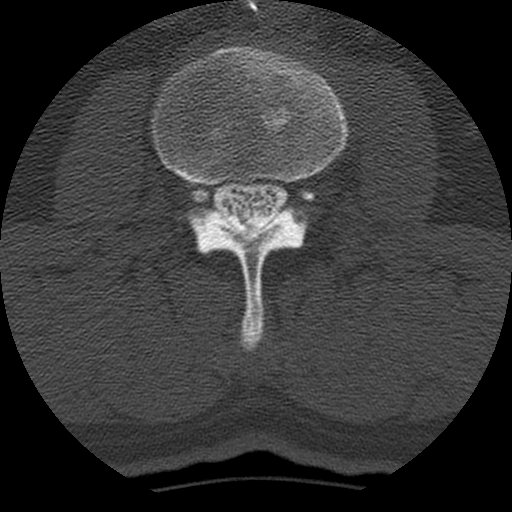

[Series 3: l spine soft · axial · 0.27mm/px · z∈[-242,-154]mm · 2 of 106 slices shown]
[im 36/106  soft-tissue]
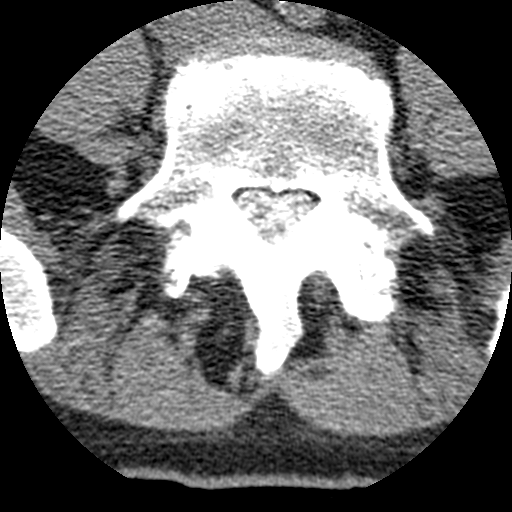
[im 71/106  soft-tissue]
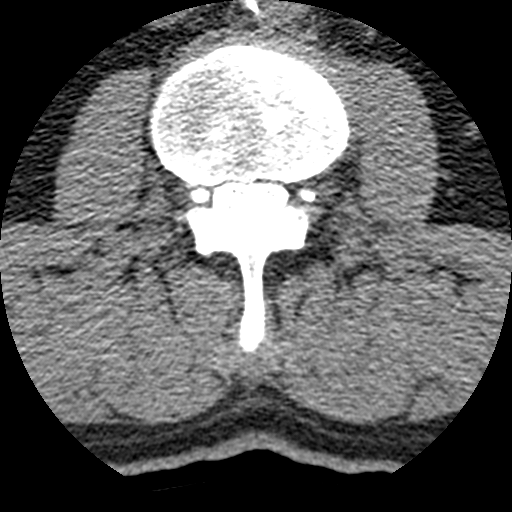

[Series 103: cor · coronal · 0.53mm/px · 3 of 58 slices shown]
[im 15/58  bone]
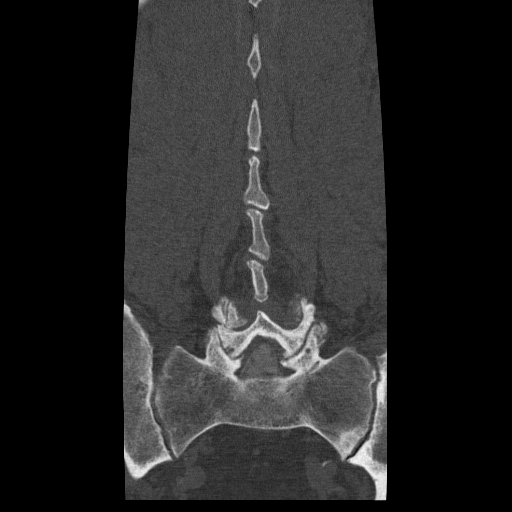
[im 29/58  bone]
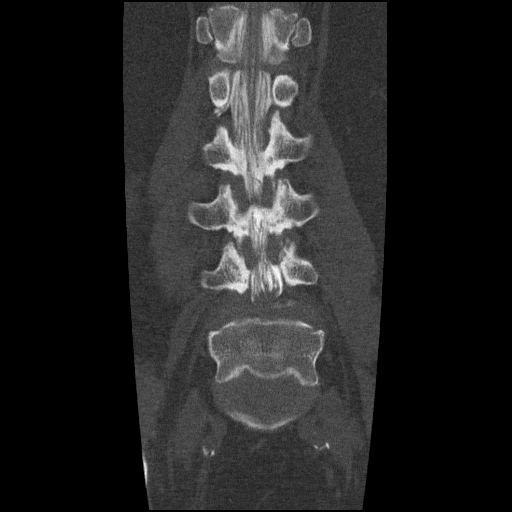
[im 43/58  bone]
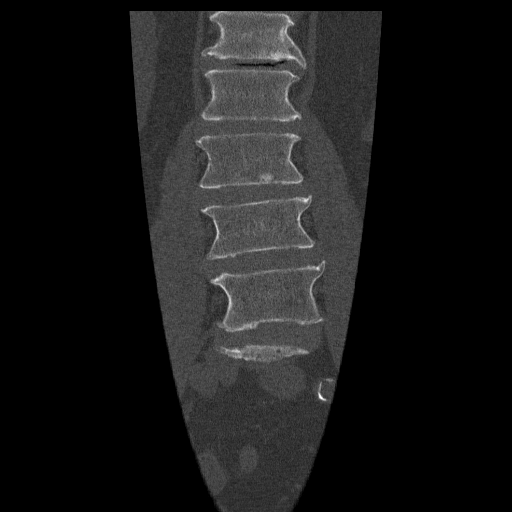

[Series 105: sag · sagittal · 0.53mm/px · 5 of 62 slices shown, 6 images]
[im 21/62  bone]
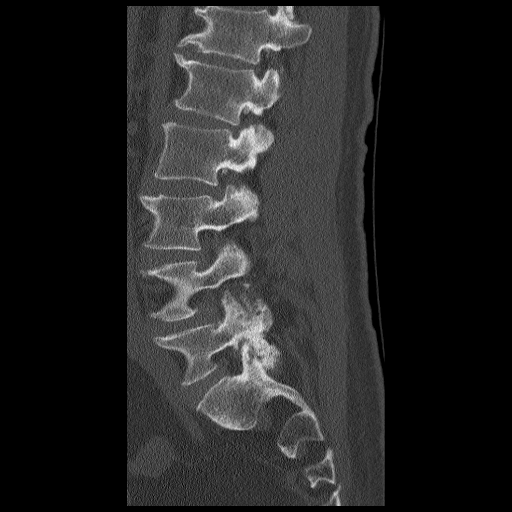
[im 26/62  bone]
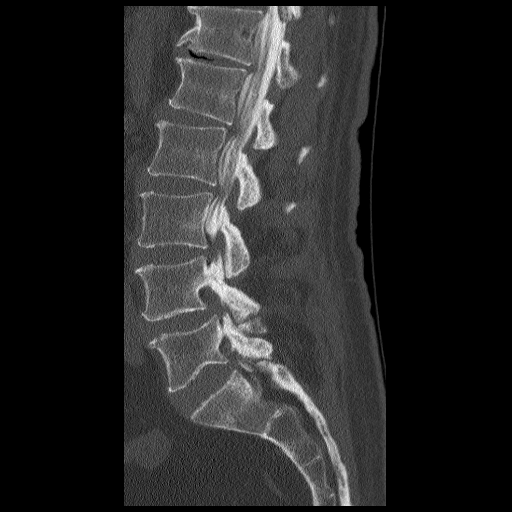
[im 31/62  soft-tissue]
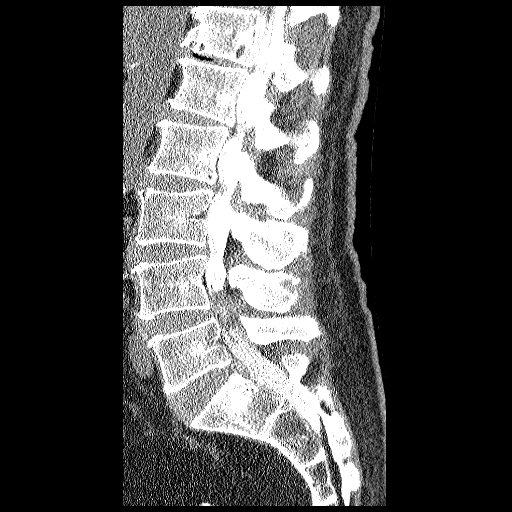
[im 31/62  bone]
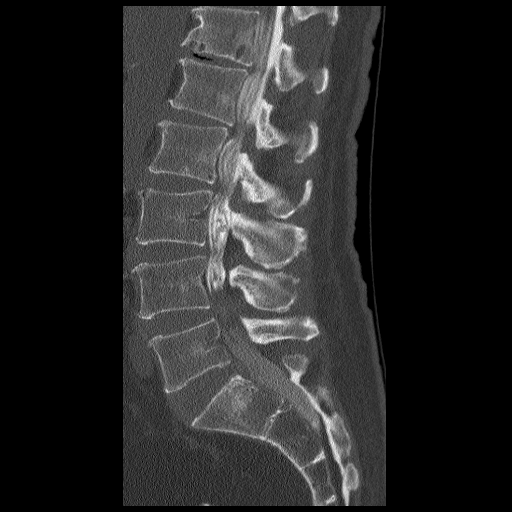
[im 36/62  bone]
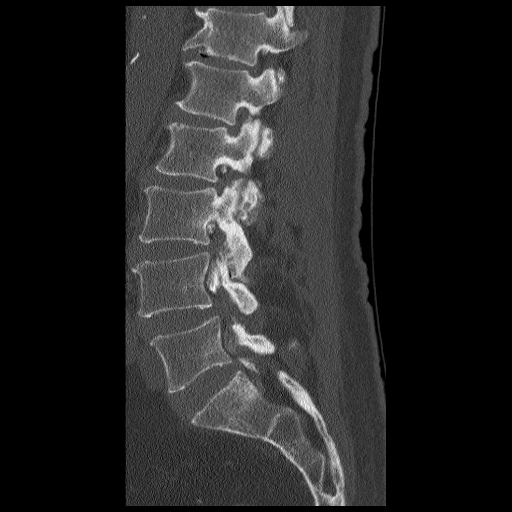
[im 41/62  bone]
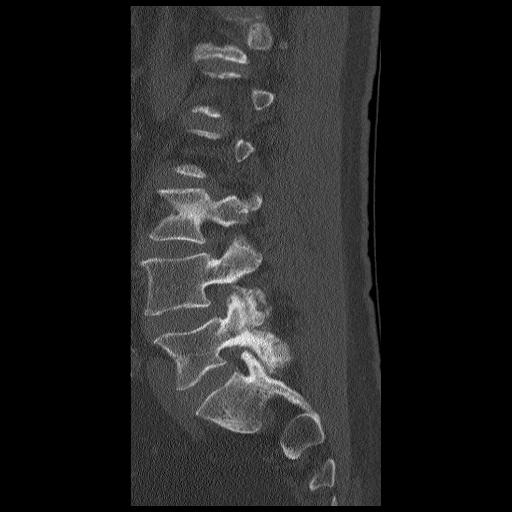

[12 of 33 positions shown; findings below may reference images not displayed]

FLUOROSCOPY TIME:  1 min 27 seconds

PROCEDURE:
After thorough discussion of risks and benefits of the procedure
including bleeding, infection, injury to nerves, blood vessels,
adjacent structures as well as headache and CSF leak, written and
oral informed consent was obtained. Consent was obtained by Dr.
Rejoice. Time out form was completed.

Patient was positioned prone on the fluoroscopy table. Local
anesthesia was provided with 1% lidocaine without epinephrine after
prepped and draped in the usual sterile fashion. Puncture was
performed at L3-4 using a 3 1/2 inch 22-gauge spinal needle via left
paramedian approach. Using a single pass through the dura, the
needle was placed within the thecal sac, with return of clear CSF.
15 mL of Vmnipaque-5VE was injected into the thecal sac, with normal
opacification of the nerve roots and cauda equina consistent with
free flow within the subarachnoid space.

I personally performed the lumbar puncture and administered the
intrathecal contrast. I also personally supervised acquisition of
the myelogram images.
FINDINGS: LUMBAR MYELOGRAM FINDINGS:

Five non rib-bearing lumbar type vertebral bodies are present.
Slight retrolisthesis is present at L1-2. The large disc protrusion
is no longer evident. There slight disc bulging at L2-3 and L3-4.
Lateral recess narrowing is worse on the left at L1-2 and L2-3. Mild
lateral recess narrowing is evident bilaterally at L3-4.

There is a relative block of contrast at L4-5 with stable
anterolisthesis. The anterolisthesis is slightly worse with
standing. It does not change significantly with flexion and
extension.

No significant stenosis is present at L5-S1.

CT LUMBAR MYELOGRAM FINDINGS:

The lumbar spine is imaged from the T11-12 disc level through S3-4.
Rightward curvature of the lumbar spine is centered at L1. Leftward
curvature is centered at L4-5. Slight retrolisthesis is present at
L1-2. Anterolisthesis is present at L4-5. Limited imaging the
abdomen demonstrates atherosclerotic changes within the abdominal
aorta without definite aneurysm.

T12-L1: Mild facet hypertrophy and disc bulging is present
bilaterally without significant stenosis.

L1-2: A broad-based disc herniation is present. Mild facet
hypertrophy is present bilaterally. Mild left lateral recess and
foraminal narrowing is present. The central disc protrusion is
significantly reduced.

L2-3: A broad-based disc herniation is asymmetric to the left. Mild
facet hypertrophy is evident bilaterally. Mild lateral recess and
foraminal narrowing is worse on the left.

L3-4: A broad-based disc herniation is present. Moderate facet
hypertrophy and ligamentum flavum thickening is present. Mild to
moderate lateral recess narrowing is evident bilaterally. Mild
foraminal narrowing is present bilaterally as well.

L4-5: There is uncovering of a broad-based disc herniation. Advanced
facet hypertrophy is present. This results in severe central canal
stenosis. Moderate foraminal narrowing is present bilaterally.

L5-S1: Moderate facet hypertrophy is present bilaterally. Facet
spurring has increased. Mild to moderate foraminal narrowing is now
present bilaterally.
IMPRESSION: LUMBAR MYELOGRAM IMPRESSION:

1. Severe central canal stenosis with anterolisthesis and uncovering
of a large broad-based disc protrusion at L4-5.
2. Broad-based disc herniation and mild lateral recess narrowing
bilaterally at L3-4.
3. Broad-based disc herniation and mild lateral recess narrowing
bilaterally at L to 3 is worse on the left.
4. The previously seen disc protrusion at L1-2 is markedly improved.
Mild retrolisthesis is stable.

CT LUMBAR MYELOGRAM IMPRESSION:

1. Significant reduction in the disc protrusion at L1-2 with
residual mild lateral recess and foraminal narrowing on the left.
2. Mild lateral recess and foraminal narrowing bilaterally at L2-3
is worse on the left.
3. Mild moderate lateral recess and mild foraminal narrowing is
present bilaterally at L3-4.
4. The worst level is L4-5 with severe central canal stenosis.
5. Moderate foraminal narrowing bilaterally at L4-5.
6. Mild to moderate foraminal stenosis at L5-S1 has developed
secondary to facet spurring since the prior exam.

## 2014-12-20 IMAGING — CR DG CHEST 2V
2 series · 2 of 2 positions shown · non-contrast
Comparison: None.

CLINICAL DATA: Preoperative films.  Patient for lumbar surgery.

EXAM:
CHEST  2 VIEW

[w chest pa]
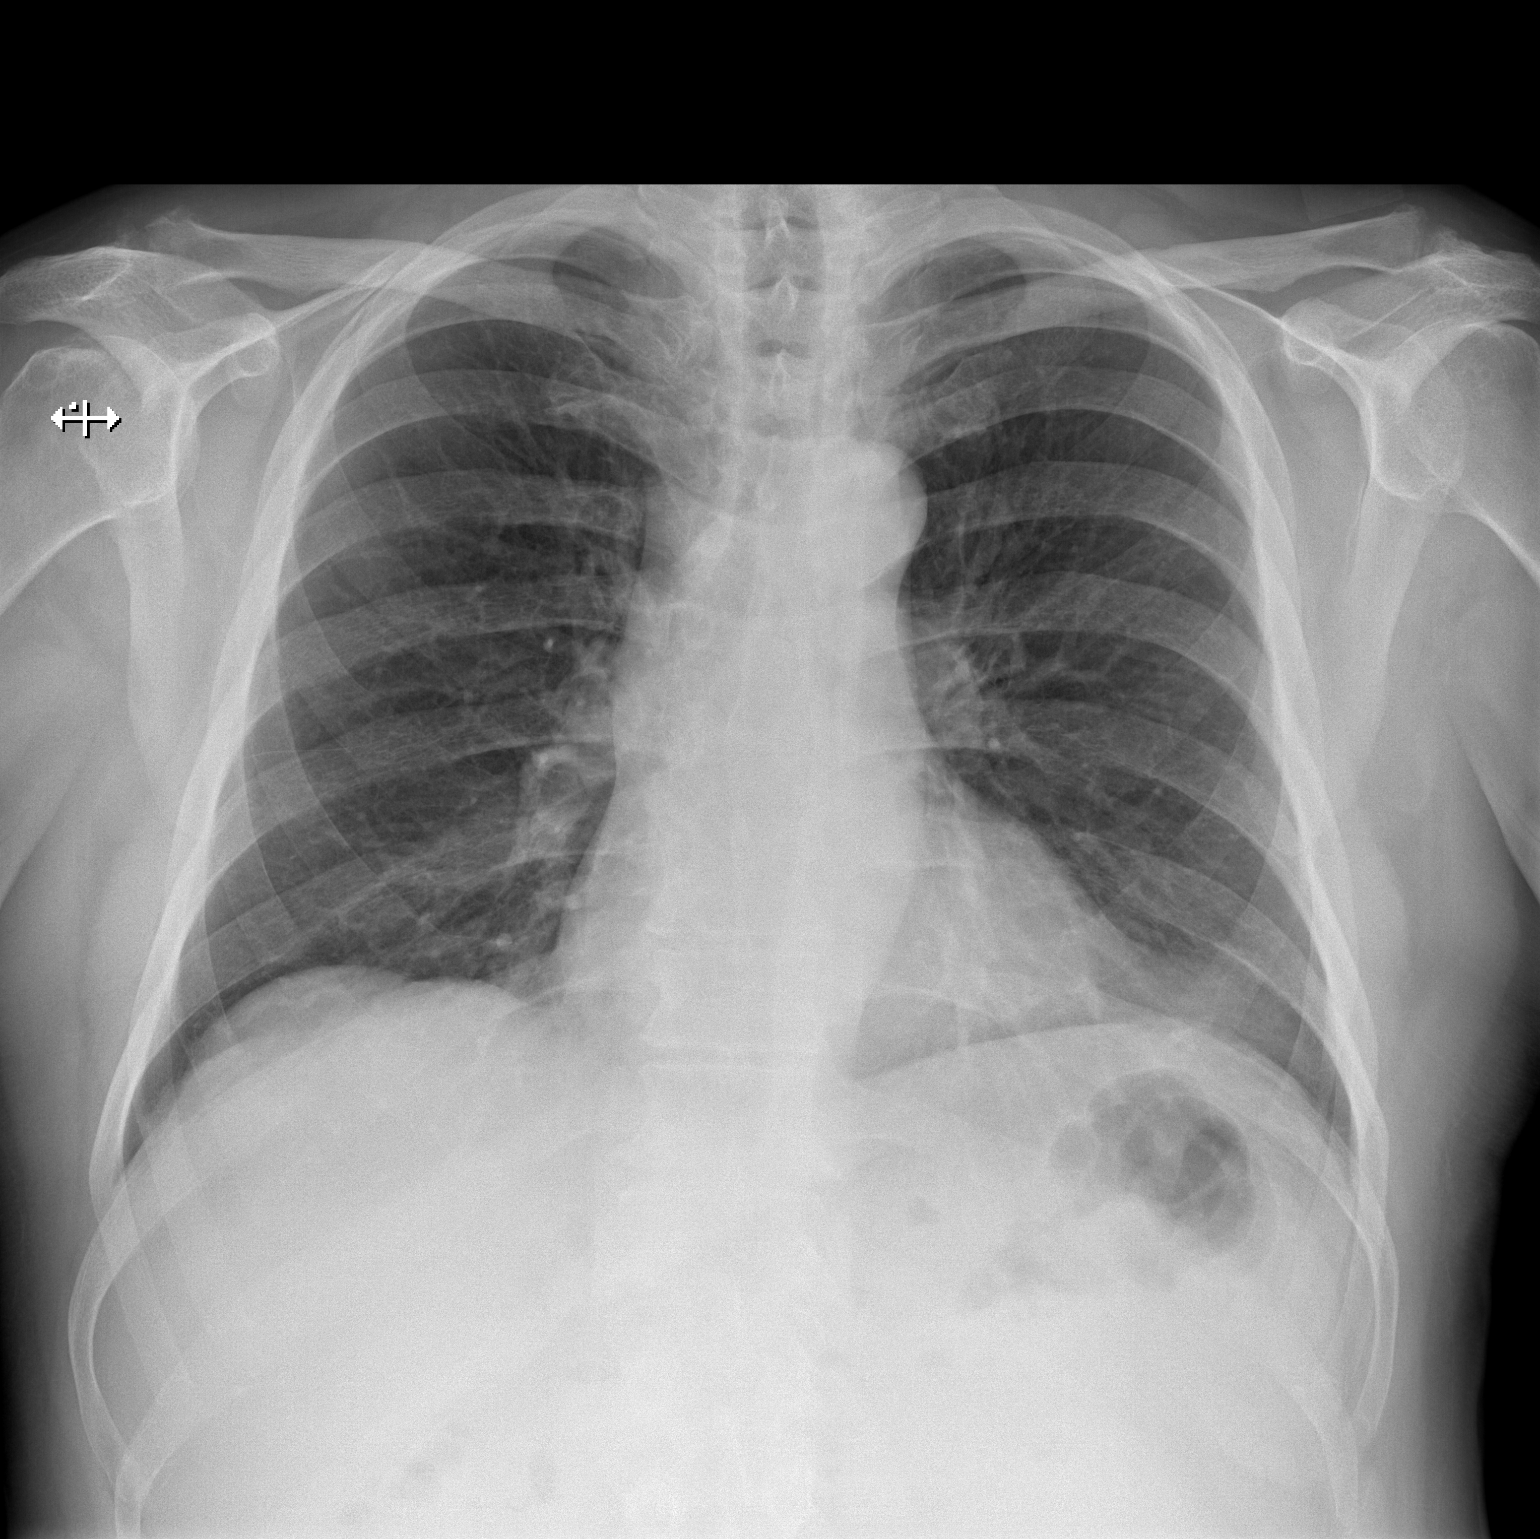

[w chest lat]
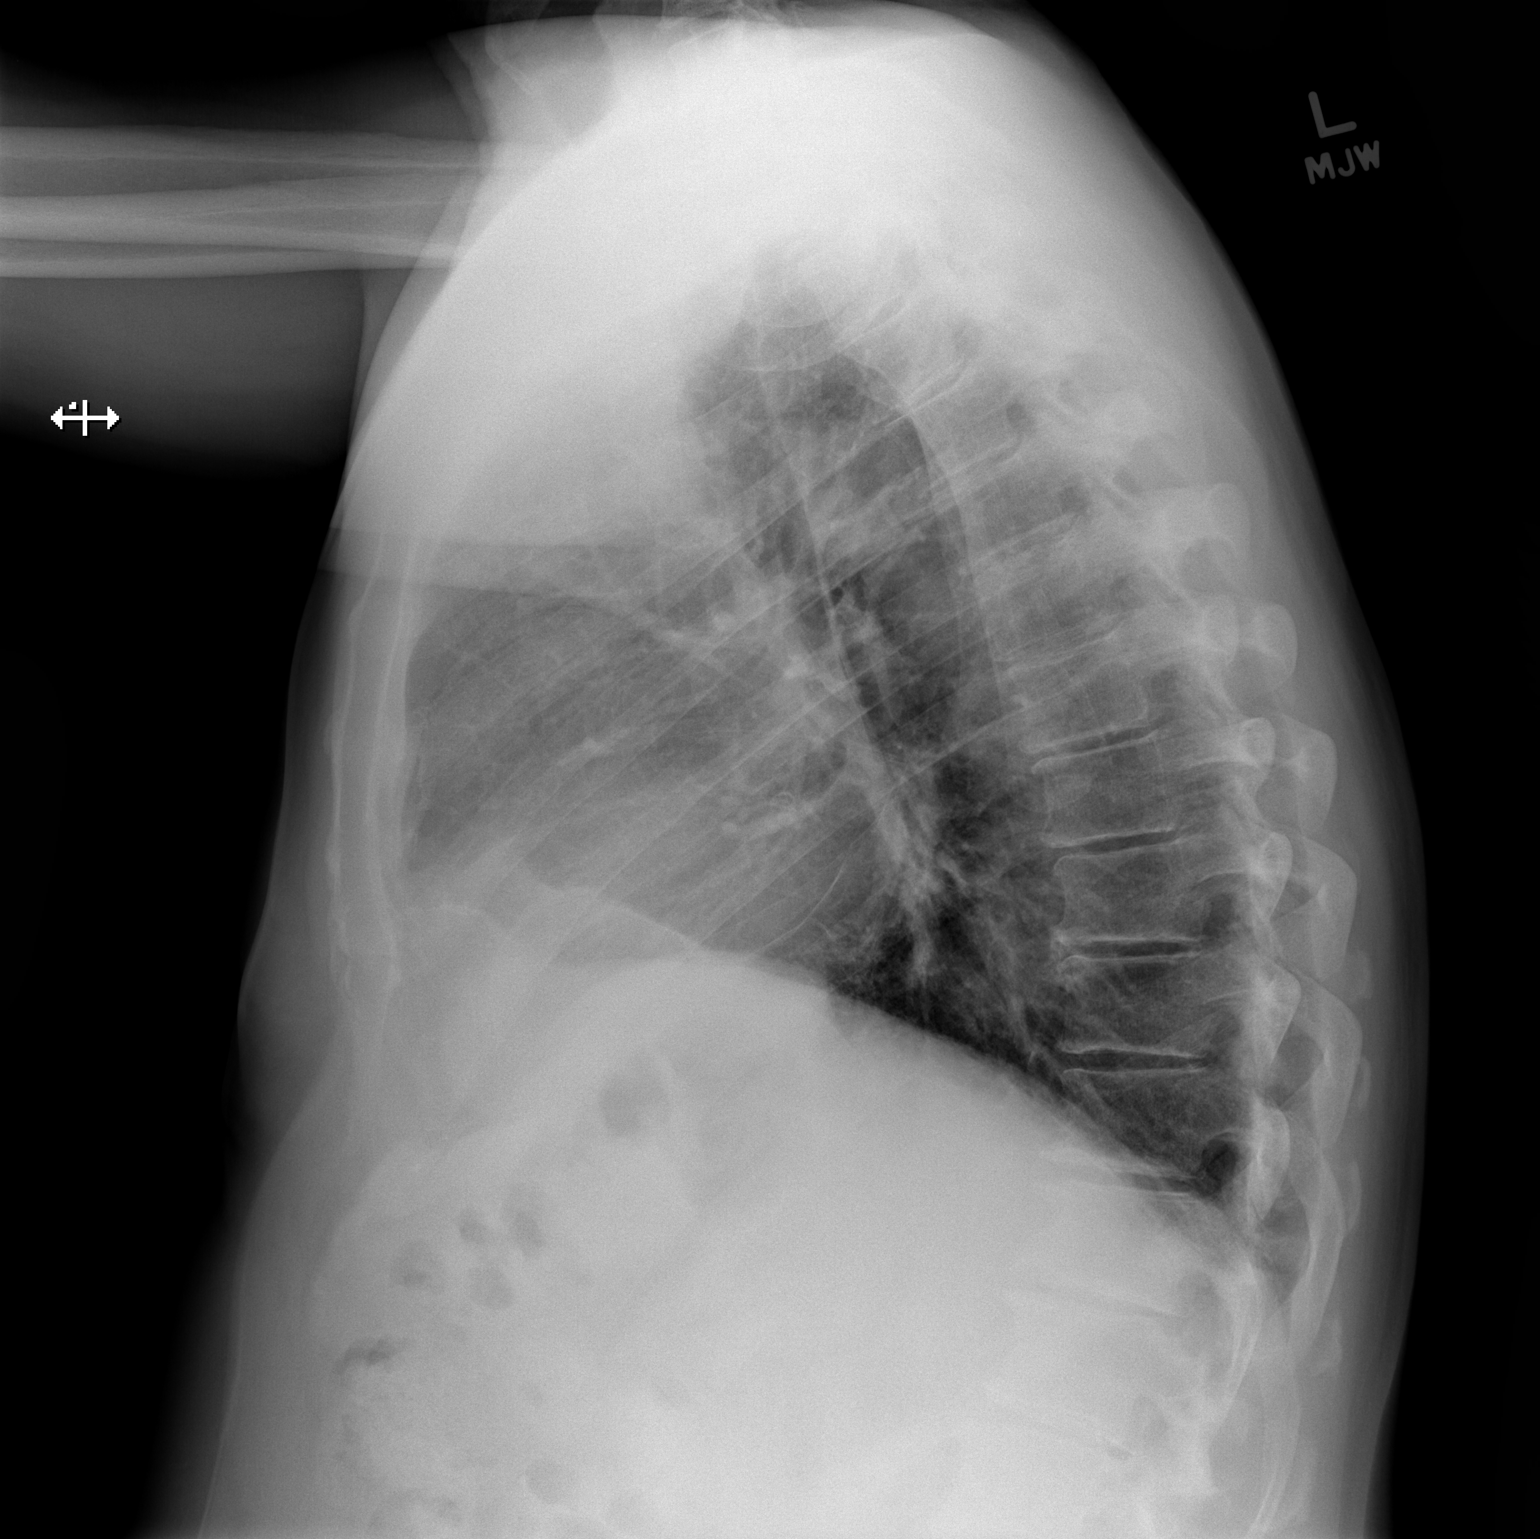

[2 of 2 positions shown; findings below may reference images not displayed]

FINDINGS: The lungs are clear. Heart size is normal. There is no pneumothorax
or pleural effusion. No focal bony abnormality is identified.
IMPRESSION: Negative chest.

## 2014-12-27 IMAGING — RF DG C-ARM 61-120 MIN
1 series · 2 of 2 positions shown · non-contrast
Comparison: none

CLINICAL DATA: plif 4-5

EXAM:
DG C-ARM 1-60 MIN
:

[Series 1: run · 2 of 2 slices shown]
[im 1/2]
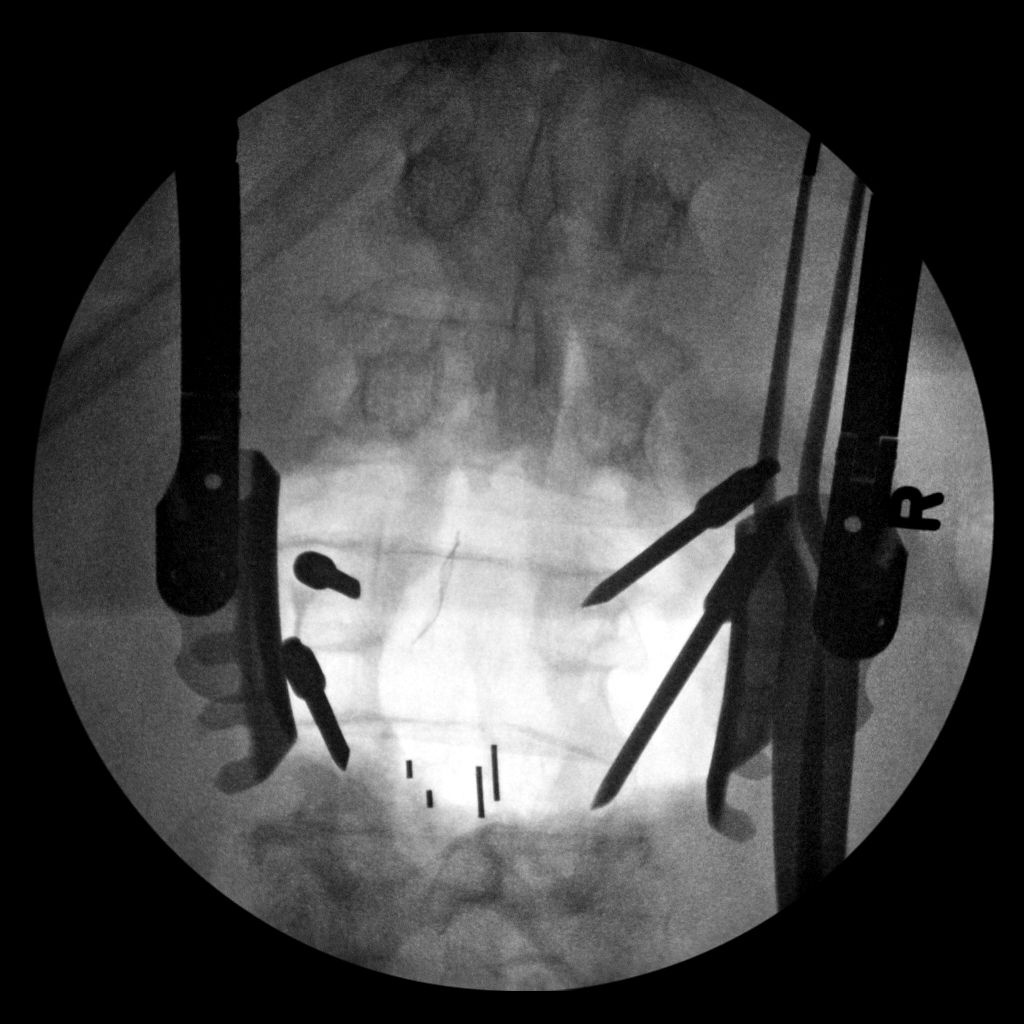
[im 2/2]
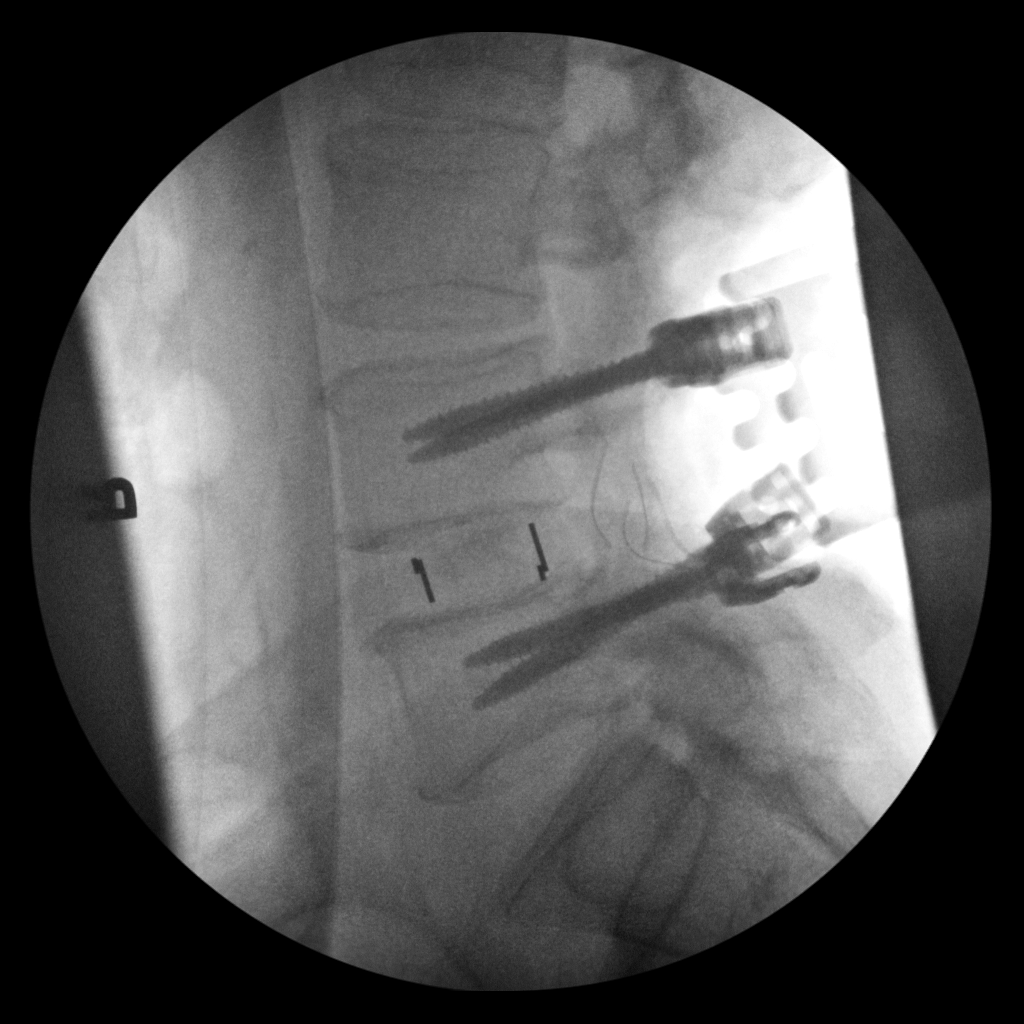

[2 of 2 positions shown; findings below may reference images not displayed]

FINDINGS: Patient is status post posterior fusion L4-5. Hardware intact.
Native osseous structures demonstrate no acute abnormalities.
Intravertebral disc space marker at L4-5.
IMPRESSION: Patient is status post posterior fusion L4-5.
